# Patient Record
Sex: Male | Born: 1960 | Race: White | Hispanic: No | Marital: Single | Smoking: Never smoker
Health system: Southern US, Community
[De-identification: ages and names within clinical notes are randomized; demographics above are authoritative.]

## PROBLEM LIST (undated history)

## (undated) DIAGNOSIS — K219 Gastro-esophageal reflux disease without esophagitis: Secondary | ICD-10-CM

## (undated) DIAGNOSIS — M1990 Unspecified osteoarthritis, unspecified site: Secondary | ICD-10-CM

## (undated) HISTORY — DX: Unspecified osteoarthritis, unspecified site: M19.90

## (undated) HISTORY — DX: Gastro-esophageal reflux disease without esophagitis: K21.9

---

## 1995-12-23 HISTORY — PX: SPINE SURGERY: SHX786

## 2009-07-06 ENCOUNTER — Ambulatory Visit: Payer: Self-pay | Admitting: Unknown Physician Specialty

## 2012-06-24 HISTORY — PX: COLONOSCOPY: SHX174

## 2012-08-19 ENCOUNTER — Ambulatory Visit: Payer: Self-pay | Admitting: Otolaryngology

## 2014-12-19 ENCOUNTER — Other Ambulatory Visit: Payer: Self-pay | Admitting: Family Medicine

## 2015-06-30 ENCOUNTER — Telehealth: Payer: Self-pay

## 2015-06-30 NOTE — Telephone Encounter (Signed)
Returning patient's call, he had already called back this morning and scheduled appointment with Dr. Laural BenesJohnson for Monday

## 2015-07-03 ENCOUNTER — Ambulatory Visit: Payer: Self-pay | Admitting: Family Medicine

## 2015-07-04 ENCOUNTER — Ambulatory Visit: Payer: Self-pay | Admitting: Family Medicine

## 2015-07-18 ENCOUNTER — Encounter: Payer: Self-pay | Admitting: Family Medicine

## 2015-07-18 ENCOUNTER — Ambulatory Visit (INDEPENDENT_AMBULATORY_CARE_PROVIDER_SITE_OTHER): Payer: 59 | Admitting: Family Medicine

## 2015-07-18 VITALS — BP 120/84 | HR 79 | Temp 98.9°F | Ht 69.0 in | Wt 188.0 lb

## 2015-07-18 DIAGNOSIS — G8929 Other chronic pain: Secondary | ICD-10-CM | POA: Diagnosis not present

## 2015-07-18 DIAGNOSIS — M542 Cervicalgia: Secondary | ICD-10-CM | POA: Insufficient documentation

## 2015-07-18 MED ORDER — CYCLOBENZAPRINE HCL 10 MG PO TABS: 10.0000 mg | ORAL_TABLET | Freq: Three times a day (TID) | ORAL | Status: DC | PRN

## 2015-07-18 MED ORDER — IBUPROFEN 800 MG PO TABS: 800.0000 mg | ORAL_TABLET | Freq: Three times a day (TID) | ORAL | Status: DC | PRN

## 2015-07-18 NOTE — Assessment & Plan Note (Signed)
Given spinous process tenderness, will obtain x-ray of his neck. Likely due to spasm. Will start flexeril and ibuprofen. Advised him to avoid any other NSAIDs when he takes it. Recheck in 2 weeks. Continue to monitor closely.

## 2015-07-18 NOTE — Progress Notes (Signed)
BP 120/84 mmHg  Pulse 79  Temp(Src) 98.9 F (37.2 C)  Ht  (1.753 m)  Wt 188 lb (85.276 kg)  BMI 27.75 kg/m2  SpO2 97%   Subjective:    Patient ID: Andres Dillon, male    DOB: 1961-04-08, 55 y.o.   MRN: [id number]  HPI: HORACIO WERTH is a 55 y.o. male  Chief Complaint  Patient presents with  . Neck Pain    upper back also, mainly neck   Had a bad fall in 1997. Had surgery on his back at that time. Neck has been hurting off and on for years only hurt for a day or so. For the past 2.5 weeks he has had more pain, has been pretty constant. Didn't do anything to hurt it. Doesn't remember when it started hurting. Doesn't remember what he was doing.  NECK PAIN FOLLOW UP- has had pain in his neck for about 2.5 weeks this time. Has had numbness and tingling in his R arm for years, has not had any changes in his numbness and tingling  Status: exacerbated Treatments attempted: biofreeze, tiger balm, heat and ibuprofen  Relief with NSAIDs?:  no Location:C7 and midline Duration: 2.5 weeks Severity: 8/10 Quality: protruding, "pain", sharp Frequency: constant Radiation: none Aggravating factors: movement Alleviating factors: tiger balm and biofreeze, laying down with a special pillow Weakness:  no Paresthesias / decreased sensation:  no  Fevers:  no  Relevant past medical, surgical, family and social history reviewed and updated as indicated. Interim medical history since our last visit reviewed. Allergies and medications reviewed and updated.  Review of Systems  Constitutional: Negative.   Respiratory: Negative.   Cardiovascular: Negative.   Gastrointestinal: Negative.   Musculoskeletal: Positive for myalgias, back pain, neck pain and neck stiffness. Negative for joint swelling, arthralgias and gait problem.  Psychiatric/Behavioral: Negative.     Per HPI unless specifically indicated above     Objective:    BP 120/84 mmHg  Pulse 79  Temp(Src) 98.9 F (37.2 C)  Ht 5'  9" (1.753 m)  Wt 188 lb (85.276 kg)  BMI 27.75 kg/m2  SpO2 97%  Wt Readings from Last 3 Encounters:  07/18/15 188 lb (85.276 kg)    Physical Exam  Constitutional: He is oriented to person, place, and time. He appears well-developed and well-nourished. No distress.  HENT:  Head: Normocephalic and atraumatic.  Right Ear: Hearing normal.  Left Ear: Hearing normal.  Nose: Nose normal.  Eyes: Conjunctivae, EOM and lids are normal. Pupils are equal, round, and reactive to light. Right eye exhibits no discharge. Left eye exhibits no discharge. No scleral icterus.  Neck: No JVD present. No tracheal deviation present. No thyromegaly present.  Cardiovascular: Normal rate, regular rhythm, normal heart sounds and intact distal pulses.  Exam reveals no gallop and no friction rub.   No murmur heard. Pulmonary/Chest: Effort normal and breath sounds normal. No stridor. No respiratory distress. He has no wheezes. He has no rales. He exhibits no tenderness.  Lymphadenopathy:    He has no cervical adenopathy.  Neurological: He is alert and oriented to person, place, and time. He has normal reflexes. He displays normal reflexes. No cranial nerve deficit. He exhibits normal muscle tone. Coordination normal.  Skin: Skin is warm, dry and intact. No rash noted. He is not diaphoretic. No erythema. No pallor.  Psychiatric: He has a normal mood and affect. His speech is normal and behavior is normal. Judgment and thought content normal. Cognition and memory  are normal.  Neck Exam:    Tenderness to Palpation: yes    Midline cervical spine: yes C6-7     Paraspinal neck musculature: yes    Trapezius: yes on the L with significant spasm    Sternocleidomastoid: no     Range of Motion, active and passive    Flexion: Decreased    Extension: Decreased    Lateral rotation: Decreased    Lateral bending: Decreased     Neuro Examination: Upper extremity DTRs normal & symmetric.  Strength and sensation intact.      Special Tests:     Spurling test: negative bilaterally    Addson's: Negative bilaterally   No results found for this or any previous visit.    Assessment & Plan:   Problem List Items Addressed This Visit      Other   Chronic neck pain - Primary    Given spinous process tenderness, will obtain x-ray of his neck. Likely due to spasm. Will start flexeril and ibuprofen. Advised him to avoid any other NSAIDs when he takes it. Recheck in 2 weeks. Continue to monitor closely.      Relevant Medications   cyclobenzaprine (FLEXERIL) 10 MG tablet   ibuprofen (ADVIL,MOTRIN) 800 MG tablet   Other Relevant Orders   DG Cervical Spine Complete       Follow up plan: Return in about 2 weeks (around 08/01/2015) for Follow up on his neck.

## 2015-07-19 ENCOUNTER — Ambulatory Visit
Admission: RE | Admit: 2015-07-19 | Discharge: 2015-07-19 | Disposition: A | Discharge status: Home / Self Care | Payer: 59 | Source: Ambulatory Visit | Attending: Family Medicine | Admitting: Family Medicine

## 2015-07-19 ENCOUNTER — Telehealth: Payer: Self-pay | Admitting: Family Medicine

## 2015-07-19 DIAGNOSIS — M542 Cervicalgia: Principal | ICD-10-CM

## 2015-07-19 DIAGNOSIS — G8929 Other chronic pain: Secondary | ICD-10-CM | POA: Insufficient documentation

## 2015-07-19 NOTE — Telephone Encounter (Signed)
Called and discussed x-ray results. Will try medicine and exercises and check back in. Continue to monitor.

## 2015-08-03 ENCOUNTER — Ambulatory Visit (INDEPENDENT_AMBULATORY_CARE_PROVIDER_SITE_OTHER): Payer: 59 | Admitting: Family Medicine

## 2015-08-03 ENCOUNTER — Encounter: Payer: Self-pay | Admitting: Family Medicine

## 2015-08-03 VITALS — BP 124/87 | HR 109 | Temp 98.3°F | Ht 67.6 in | Wt 189.0 lb

## 2015-08-03 DIAGNOSIS — G8929 Other chronic pain: Secondary | ICD-10-CM

## 2015-08-03 DIAGNOSIS — M542 Cervicalgia: Secondary | ICD-10-CM

## 2015-08-03 NOTE — Progress Notes (Signed)
BP 124/87 mmHg  Pulse 109  Temp(Src) 98.3 F (36.8 C)  Ht 5' 7.6" (1.717 m)  Wt 189 lb (85.73 kg)  BMI 29.08 kg/m2  SpO2 99%   Subjective:    Patient ID: Andres Dillon, male    DOB: 07/26/1960, 55 y.o.   MRN: [id number]  HPI: Andres Dillon is a 55 y.o. male  Chief Complaint  Patient presents with  . Neck Pain    Patient states that his neck is fine as long as he continues with the flexeril and motrin, but when he stops it the pain starts again.   NECK PAIN FOLLOW UP Diagnosis: arthritis and muscle spasm Status: better Treatments attempted: rest, ice, heat, APAP, ibuprofen, aleve and muscle relaxer  Compliant with recommended treatment: average Relief with NSAIDs?:  significant Location:midline and C7 Duration: 4.5 weeks Severity: 4/10 Quality:  Sharp occasionally Frequency: intermittent Radiation: none Aggravating factors: movement Alleviating factors: rest, ice, heat, NSAIDs and muscle relaxer Weakness:  no Paresthesias / decreased sensation:  no  Fevers:  no  Relevant past medical, surgical, family and social history reviewed and updated as indicated. Interim medical history since our last visit reviewed. Allergies and medications reviewed and updated.  Review of Systems  Constitutional: Negative.   Respiratory: Negative.   Cardiovascular: Negative.   Musculoskeletal: Positive for myalgias and neck stiffness. Negative for back pain, joint swelling, arthralgias, gait problem and neck pain.  Skin: Negative.   Psychiatric/Behavioral: Negative.     Per HPI unless specifically indicated above     Objective:    BP 124/87 mmHg  Pulse 109  Temp(Src) 98.3 F (36.8 C)  Ht 5' 7.6" (1.717 m)  Wt 189 lb (85.73 kg)  BMI 29.08 kg/m2  SpO2 99%  Wt Readings from Last 3 Encounters:  08/03/15 189 lb (85.73 kg)  07/18/15 188 lb (85.276 kg)    Physical Exam  Constitutional: He is oriented to person, place, and time. He appears well-developed and well-nourished. No  distress.  HENT:  Head: Normocephalic and atraumatic.  Right Ear: Hearing normal.  Left Ear: Hearing normal.  Nose: Nose normal.  Eyes: Conjunctivae and lids are normal. Right eye exhibits no discharge. Left eye exhibits no discharge. No scleral icterus.  Pulmonary/Chest: Effort normal. No respiratory distress.  Musculoskeletal: Normal range of motion. He exhibits no edema or tenderness.  Neurological: He is alert and oriented to person, place, and time. He has normal reflexes. He displays normal reflexes. No cranial nerve deficit. He exhibits normal muscle tone. Coordination normal.  Skin: Skin is warm, dry and intact. No rash noted. No erythema. No pallor.  Psychiatric: He has a normal mood and affect. His speech is normal and behavior is normal. Judgment and thought content normal. Cognition and memory are normal.  Nursing note and vitals reviewed.   No results found for this or any previous visit.    Assessment & Plan:   Problem List Items Addressed This Visit      Other   Chronic neck pain - Primary    Doing much better with medication. Has not done his stretches. Will work on them and we will check in by phone in 2 weeks, if not doing better, will see PT.          Follow up plan: Return in about 4 weeks (around 08/31/2015) for follow up neck.

## 2015-08-03 NOTE — Assessment & Plan Note (Signed)
Doing much better with medication. Has not done his stretches. Will work on them and we will check in by phone in 2 weeks, if not doing better, will see PT.

## 2015-08-12 ENCOUNTER — Other Ambulatory Visit: Payer: Self-pay | Admitting: Family Medicine

## 2015-09-07 ENCOUNTER — Other Ambulatory Visit: Payer: Self-pay | Admitting: Family Medicine

## 2015-10-10 ENCOUNTER — Other Ambulatory Visit: Payer: Self-pay | Admitting: Family Medicine

## 2016-07-02 ENCOUNTER — Other Ambulatory Visit: Payer: Self-pay | Admitting: Family Medicine

## 2016-07-02 ENCOUNTER — Encounter: Payer: Self-pay | Admitting: Family Medicine

## 2016-07-02 ENCOUNTER — Ambulatory Visit (INDEPENDENT_AMBULATORY_CARE_PROVIDER_SITE_OTHER): Payer: 59 | Admitting: Family Medicine

## 2016-07-02 VITALS — BP 122/83 | HR 77 | Temp 98.6°F | Wt 185.0 lb

## 2016-07-02 DIAGNOSIS — J069 Acute upper respiratory infection, unspecified: Secondary | ICD-10-CM

## 2016-07-02 LAB — VERITOR FLU A/B WAIVED
Influenza A: NEGATIVE
Influenza B: NEGATIVE

## 2016-07-02 MED ORDER — IBUPROFEN 800 MG PO TABS: 800.0000 mg | ORAL_TABLET | Freq: Three times a day (TID) | ORAL | 0 refills | Status: DC | PRN

## 2016-07-02 MED ORDER — BENZONATATE 100 MG PO CAPS: 200.0000 mg | ORAL_CAPSULE | Freq: Three times a day (TID) | ORAL | 0 refills | Status: DC | PRN

## 2016-07-02 MED ORDER — ALBUTEROL SULFATE HFA 108 (90 BASE) MCG/ACT IN AERS: 2.0000 | INHALATION_SPRAY | Freq: Four times a day (QID) | RESPIRATORY_TRACT | 0 refills | Status: DC | PRN

## 2016-07-02 MED ORDER — AMOXICILLIN-POT CLAVULANATE 875-125 MG PO TABS: 1.0000 | ORAL_TABLET | Freq: Two times a day (BID) | ORAL | 0 refills | Status: DC

## 2016-07-02 NOTE — Progress Notes (Signed)
BP 122/83   Pulse 77   Temp 98.6 F (37 C)   Wt 185 lb (83.9 kg)   SpO2 97%   BMI 28.46 kg/m    Subjective:    Patient ID: Andres Dillon, male    DOB: [birth year], 56 y.o.   MRN: [id number]  HPI: Andres Dillon is a 56 y.o. male  Chief Complaint  Patient presents with  . URI    x 10 days. fever, some body aches, productive cough, chest congestion, nose congestion, some scratchy throat. No ear ache.   Patient presents with 10 day history of fever, chills, body aches, sweats, productive cough with brown sputum, and congestion. Denies CP, SOB, ear pain, N/V/D. Taking mucinex, cough syrups, vapor rub with minimal relief. 99.4 Tmax over the duration. No sick contacts reported.   Past Medical History:  Diagnosis Date  . Arthritis   . GERD (gastroesophageal reflux disease)    Social History   Social History  . Marital status: Married    Spouse name: N/A  . Number of children: N/A  . Years of education: N/A   Occupational History  . Not on file.   Social History Main Topics  . Smoking status: Never Smoker  . Smokeless tobacco: Never Used  . Alcohol use Yes  . Drug use: No  . Sexual activity: Not on file   Other Topics Concern  . Not on file   Social History Narrative  . No narrative on file    Relevant past medical, surgical, family and social history reviewed and updated as indicated. Interim medical history since our last visit reviewed. Allergies and medications reviewed and updated.  Review of Systems  Constitutional: Positive for chills, diaphoresis and fever.  HENT: Positive for congestion and sore throat.   Eyes: Negative.   Respiratory: Positive for cough.   Cardiovascular: Negative.   Gastrointestinal: Negative.   Genitourinary: Negative.   Musculoskeletal: Positive for myalgias.  Neurological: Negative.   Psychiatric/Behavioral: Negative.     Per HPI unless specifically indicated above     Objective:    BP 122/83   Pulse 77   Temp 98.6  F (37 C)   Wt 185 lb (83.9 kg)   SpO2 97%   BMI 28.46 kg/m   Wt Readings from Last 3 Encounters:  07/02/16 185 lb (83.9 kg)  08/03/15 189 lb (85.7 kg)  07/18/15 188 lb (85.3 kg)    Physical Exam  Constitutional: He is oriented to person, place, and time. He appears well-developed and well-nourished.  HENT:  Head: Atraumatic.  Eyes: Conjunctivae are normal. Pupils are equal, round, and reactive to light.  Neck: Normal range of motion. Neck supple.  Cardiovascular: Normal rate and normal heart sounds.   Pulmonary/Chest: Effort normal. No respiratory distress. He has wheezes (minimal wheezes right lung). He has no rales.  Musculoskeletal: Normal range of motion.  Neurological: He is alert and oriented to person, place, and time.  Skin: Skin is warm and dry.  Psychiatric: He has a normal mood and affect. His behavior is normal.  Nursing note and vitals reviewed.  Rapid flu test - negative  No results found for this or any previous visit.    Assessment & Plan:   Problem List Items Addressed This Visit    None    Visit Diagnoses    Upper respiratory tract infection, unspecified type    -  Primary   Pt specifically requesting augmentin over z-pak, will also send tessalon perles and  albuterol for prn use. Supportive care discussed.    Relevant Orders   Influenza A & B (STAT)       Follow up plan: Return if symptoms worsen or fail to improve.

## 2016-07-02 NOTE — Patient Instructions (Signed)
Follow up as needed

## 2016-07-02 NOTE — Telephone Encounter (Signed)
Routing to provider. It looks like the tessalon perles should be at the pharmacy, but can you resend it?  He also wants a refill on the Ibuprofen 800mg .

## 2016-12-18 ENCOUNTER — Ambulatory Visit (INDEPENDENT_AMBULATORY_CARE_PROVIDER_SITE_OTHER): Payer: 59 | Admitting: Family Medicine

## 2016-12-18 ENCOUNTER — Encounter: Payer: Self-pay | Admitting: Family Medicine

## 2016-12-18 DIAGNOSIS — Z114 Encounter for screening for human immunodeficiency virus [HIV]: Secondary | ICD-10-CM

## 2016-12-18 DIAGNOSIS — G8929 Other chronic pain: Secondary | ICD-10-CM

## 2016-12-18 DIAGNOSIS — Z1159 Encounter for screening for other viral diseases: Secondary | ICD-10-CM

## 2016-12-18 DIAGNOSIS — Z131 Encounter for screening for diabetes mellitus: Secondary | ICD-10-CM | POA: Diagnosis not present

## 2016-12-18 DIAGNOSIS — R0602 Shortness of breath: Secondary | ICD-10-CM | POA: Diagnosis not present

## 2016-12-18 DIAGNOSIS — Z1329 Encounter for screening for other suspected endocrine disorder: Secondary | ICD-10-CM

## 2016-12-18 DIAGNOSIS — Z13220 Encounter for screening for lipoid disorders: Secondary | ICD-10-CM

## 2016-12-18 DIAGNOSIS — R0600 Dyspnea, unspecified: Secondary | ICD-10-CM | POA: Insufficient documentation

## 2016-12-18 DIAGNOSIS — M542 Cervicalgia: Secondary | ICD-10-CM | POA: Diagnosis not present

## 2016-12-18 DIAGNOSIS — Z125 Encounter for screening for malignant neoplasm of prostate: Secondary | ICD-10-CM

## 2016-12-18 DIAGNOSIS — Z981 Arthrodesis status: Secondary | ICD-10-CM

## 2016-12-18 DIAGNOSIS — Z0001 Encounter for general adult medical examination with abnormal findings: Secondary | ICD-10-CM

## 2016-12-18 DIAGNOSIS — R0609 Other forms of dyspnea: Secondary | ICD-10-CM | POA: Insufficient documentation

## 2016-12-18 DIAGNOSIS — Z0000 Encounter for general adult medical examination without abnormal findings: Secondary | ICD-10-CM

## 2016-12-18 LAB — URINALYSIS, ROUTINE W REFLEX MICROSCOPIC
BILIRUBIN UA: NEGATIVE
GLUCOSE, UA: NEGATIVE
Ketones, UA: NEGATIVE
Leukocytes, UA: NEGATIVE
NITRITE UA: NEGATIVE
Protein, UA: NEGATIVE
RBC UA: NEGATIVE
Specific Gravity, UA: 1.015 (ref 1.005–1.030)
UUROB: 0.2 mg/dL (ref 0.2–1.0)
pH, UA: 6 (ref 5.0–7.5)

## 2016-12-18 NOTE — Progress Notes (Signed)
There were no vitals taken for this visit.   Subjective:    Patient ID: Andres Dillon, male    DOB: 01/14/1961, 56 y.o.   MRN: [id number]  HPI: Andres Dillon is a 56 y.o. male  Chief Complaint  Patient presents with  . Annual Exam  Patient for physical biggest concerns are negative and back ongoing chronic pain and is status post lumbar fusion from 1997 on lower back. Does have chronic neck pain feels it's possibly related to a fall he had 20 years or so ago. Has already scheduled appointments with neurosurgery is going to do some imaging studies later this summer.  Patient also tosses and turns all night long sometimes takes an ibuprofen or something which may help.  Patient also with concerns has family history of coronary artery disease has developed rectal dysfunction over the last year or so has some shortness of breath and heaviness sensation with climbing steps no real chest pain nausea vomiting or radiation. This is been ongoing for several years maybe getting worse over the last months. Also ED sx.  Relevant past medical, surgical, family and social history reviewed and updated as indicated. Interim medical history since our last visit reviewed. Allergies and medications reviewed and updated.  Review of Systems  Constitutional: Negative.   HENT: Negative.   Eyes: Negative.   Respiratory: Negative.   Cardiovascular: Negative.   Gastrointestinal: Negative.   Endocrine: Negative.   Genitourinary: Negative.   Musculoskeletal: Negative.   Skin: Negative.   Allergic/Immunologic: Negative.   Neurological: Negative.   Hematological: Negative.   Psychiatric/Behavioral: Negative.    EKG normal sinus with normal EKG.  Per HPI unless specifically indicated above     Objective:    There were no vitals taken for this visit.  Wt Readings from Last 3 Encounters:  07/02/16 185 lb (83.9 kg)  08/03/15 189 lb (85.7 kg)  07/18/15 188 lb (85.3 kg)    Physical Exam    Constitutional: He is oriented to person, place, and time. He appears well-developed and well-nourished.  HENT:  Head: Normocephalic and atraumatic.  Right Ear: External ear normal.  Left Ear: External ear normal.  Eyes: Conjunctivae and EOM are normal. Pupils are equal, round, and reactive to light.  Neck: Normal range of motion. Neck supple.  Cardiovascular: Normal rate, regular rhythm, normal heart sounds and intact distal pulses.   Pulmonary/Chest: Effort normal and breath sounds normal.  Abdominal: Soft. Bowel sounds are normal. There is no splenomegaly or hepatomegaly.  Genitourinary: Rectum normal, prostate normal and penis normal.  Musculoskeletal: Normal range of motion.  Neurological: He is alert and oriented to person, place, and time. He has normal reflexes.  Skin: No rash noted. No erythema.  Psychiatric: He has a normal mood and affect. His behavior is normal. Judgment and thought content normal.    Results for orders placed or performed in visit on 07/02/16  Influenza A & B (STAT)  Result Value Ref Range   Influenza A Negative Negative   Influenza B Negative Negative      Assessment & Plan:   Problem List Items Addressed This Visit      Other   Chronic neck pain    Having check in 1 mo      S/P lumbar fusion   Short of breath on exertion    EKG normal but with patient's symptoms family history ED will refer to cardiology to further evaluate.      Relevant Orders  EKG 12-Lead (Completed)   Ambulatory referral to Cardiology    Other Visit Diagnoses    Annual physical exam    -  Primary   Relevant Orders   Comprehensive metabolic panel   Screening cholesterol level       Relevant Orders   Lipid panel   Thyroid disorder screen       Relevant Orders   TSH   Screening for diabetes mellitus (DM)       Relevant Orders   CBC with Differential/Platelet   Urinalysis, Routine w reflex microscopic   Prostate cancer screening       Relevant Orders   PSA    Need for hepatitis C screening test       Relevant Orders   Hepatitis C Antibody   Encounter for screening for HIV       Relevant Orders   HIV antibody (with reflex)       Follow up plan: Return in about 1 year (around 12/18/2017), or if symptoms worsen or fail to improve, for Physical Exam.

## 2016-12-18 NOTE — Assessment & Plan Note (Signed)
EKG normal but with patient's symptoms family history ED will refer to cardiology to further evaluate.

## 2016-12-18 NOTE — Assessment & Plan Note (Signed)
Having check in 1 mo

## 2016-12-19 ENCOUNTER — Encounter: Payer: Self-pay | Admitting: Family Medicine

## 2016-12-19 LAB — COMPREHENSIVE METABOLIC PANEL
A/G RATIO: 1.9 (ref 1.2–2.2)
ALT: 28 IU/L (ref 0–44)
AST: 19 IU/L (ref 0–40)
Albumin: 4.4 g/dL (ref 3.5–5.5)
Alkaline Phosphatase: 53 IU/L (ref 39–117)
BUN / CREAT RATIO: 14 (ref 9–20)
BUN: 14 mg/dL (ref 6–24)
Bilirubin Total: 0.7 mg/dL (ref 0.0–1.2)
CO2: 23 mmol/L (ref 20–29)
Calcium: 9.5 mg/dL (ref 8.7–10.2)
Chloride: 103 mmol/L (ref 96–106)
Creatinine, Ser: 1.03 mg/dL (ref 0.76–1.27)
GFR calc non Af Amer: 81 mL/min/{1.73_m2} (ref >59)
GFR, EST AFRICAN AMERICAN: 94 mL/min/{1.73_m2} (ref >59)
GLOBULIN, TOTAL: 2.3 g/dL (ref 1.5–4.5)
Glucose: 95 mg/dL (ref 65–99)
POTASSIUM: 4.1 mmol/L (ref 3.5–5.2)
SODIUM: 142 mmol/L (ref 134–144)
TOTAL PROTEIN: 6.7 g/dL (ref 6.0–8.5)

## 2016-12-19 LAB — CBC WITH DIFFERENTIAL/PLATELET
BASOS: 0 %
Basophils Absolute: 0 10*3/uL (ref 0.0–0.2)
EOS (ABSOLUTE): 0.2 10*3/uL (ref 0.0–0.4)
EOS: 3 %
HEMATOCRIT: 40 % (ref 37.5–51.0)
Hemoglobin: 13.3 g/dL (ref 13.0–17.7)
Immature Grans (Abs): 0 10*3/uL (ref 0.0–0.1)
Immature Granulocytes: 1 %
Lymphocytes Absolute: 1.3 10*3/uL (ref 0.7–3.1)
Lymphs: 25 %
MCH: 30 pg (ref 26.6–33.0)
MCHC: 33.3 g/dL (ref 31.5–35.7)
MCV: 90 fL (ref 79–97)
MONOS ABS: 0.4 10*3/uL (ref 0.1–0.9)
Monocytes: 8 %
NEUTROS PCT: 63 %
Neutrophils Absolute: 3.3 10*3/uL (ref 1.4–7.0)
Platelets: 214 10*3/uL (ref 150–379)
RBC: 4.43 x10E6/uL (ref 4.14–5.80)
RDW: 14.2 % (ref 12.3–15.4)
WBC: 5.2 10*3/uL (ref 3.4–10.8)

## 2016-12-19 LAB — LIPID PANEL
Chol/HDL Ratio: 4.4 ratio (ref 0.0–5.0)
Cholesterol, Total: 196 mg/dL (ref 100–199)
HDL: 45 mg/dL (ref >39)
LDL Calculated: 120 mg/dL — ABNORMAL HIGH (ref 0–99)
Triglycerides: 155 mg/dL — ABNORMAL HIGH (ref 0–149)
VLDL Cholesterol Cal: 31 mg/dL (ref 5–40)

## 2016-12-19 LAB — PSA: Prostate Specific Ag, Serum: 2.3 ng/mL (ref 0.0–4.0)

## 2016-12-19 LAB — HIV ANTIBODY (ROUTINE TESTING W REFLEX): HIV SCREEN 4TH GENERATION: NONREACTIVE

## 2016-12-19 LAB — HEPATITIS C ANTIBODY

## 2016-12-19 LAB — TSH: TSH: 1.16 u[IU]/mL (ref 0.450–4.500)

## 2017-03-19 ENCOUNTER — Encounter: Payer: Self-pay | Admitting: Internal Medicine

## 2017-03-19 ENCOUNTER — Ambulatory Visit (INDEPENDENT_AMBULATORY_CARE_PROVIDER_SITE_OTHER): Payer: BLUE CROSS/BLUE SHIELD | Admitting: Internal Medicine

## 2017-03-19 VITALS — BP 110/68 | HR 89 | Ht 69.0 in | Wt 191.5 lb

## 2017-03-19 DIAGNOSIS — M542 Cervicalgia: Secondary | ICD-10-CM

## 2017-03-19 DIAGNOSIS — R0609 Other forms of dyspnea: Secondary | ICD-10-CM | POA: Diagnosis not present

## 2017-03-19 DIAGNOSIS — R0600 Dyspnea, unspecified: Secondary | ICD-10-CM

## 2017-03-19 LAB — EKG 12-LEAD

## 2017-03-19 NOTE — Patient Instructions (Signed)
Medication Instructions:  Your physician recommends that you continue on your current medications as directed. Please refer to the Current Medication list given to you today.   Labwork: none  Testing/Procedures: Your physician has requested that you have an echocardiogram. Echocardiography is a painless test that uses sound waves to create images of your heart. It provides your doctor with information about the size and shape of your heart and how well your heart's chambers and valves are working. This procedure takes approximately one hour. There are no restrictions for this procedure.  Your physician has requested that you have an exercise tolerance test. For further information please visit [email or web address] Please also follow instruction sheet, as given.    Follow-Up: Your physician recommends that you schedule a follow-up appointment in: 1 MONTH WITH DR END.     Exercise Stress Electrocardiogram An exercise stress electrocardiogram is a test to check how blood flows to your heart. It is done to find areas of poor blood flow. You will need to walk on a treadmill for this test. The electrocardiogram will record your heartbeat when you are at rest and when you are exercising. What happens before the procedure?  Do not have drinks with caffeine or foods with caffeine for 24 hours before the test, or as told by your doctor. This includes coffee, tea (even decaf tea), sodas, chocolate, and cocoa.  Follow your doctor's instructions about eating and drinking before the test.  Ask your doctor what medicines you should or should not take before the test. Take your medicines with water unless told by your doctor not to.  If you use an inhaler, bring it with you to the test.  Bring a snack to eat after the test.  Do not  smoke for 4 hours before the test.  Do not put lotions, powders, creams, or oils on your chest before the test.  Wear comfortable shoes and clothing. What happens  during the procedure?  You will have patches put on your chest. Small areas of your chest may need to be shaved. Wires will be connected to the patches.  Your heart rate will be watched while you are resting and while you are exercising.  You will walk on the treadmill. The treadmill will slowly get faster to raise your heart rate.  The test will take about 1-2 hours. What happens after the procedure?  Your heart rate and blood pressure will be watched after the test.  You may return to your normal diet, activities, and medicines or as told by your doctor. This information is not intended to replace advice given to you by your health care provider. Make sure you discuss any questions you have with your health care provider. Document Released: 11/27/2007 Document Revised: 02/07/2016 Document Reviewed: 02/15/2013 Elsevier Interactive Patient Education  2018 [employer].    Echocardiogram An echocardiogram, or echocardiography, uses sound waves (ultrasound) to produce an image of your heart. The echocardiogram is simple, painless, obtained within a short period of time, and offers valuable information to your health care provider. The images from an echocardiogram can provide information such as:  Evidence of coronary artery disease (CAD).  Heart size.  Heart muscle function.  Heart valve function.  Aneurysm detection.  Evidence of a past heart attack.  Fluid buildup around the heart.  Heart muscle thickening.  Assess heart valve function.  Tell a health care provider about:  Any allergies you have.  All medicines you are taking, including vitamins, herbs, eye drops, creams,  and over-the-counter medicines.  Any problems you or family members have had with anesthetic medicines.  Any blood disorders you have.  Any surgeries you have had.  Any medical conditions you have.  Whether you are pregnant or may be pregnant. What happens before the procedure? No special  preparation is needed. Eat and drink normally. What happens during the procedure?  In order to produce an image of your heart, gel will be applied to your chest and a wand-like tool (transducer) will be moved over your chest. The gel will help transmit the sound waves from the transducer. The sound waves will harmlessly bounce off your heart to allow the heart images to be captured in real-time motion. These images will then be recorded.  You may need an IV to receive a medicine that improves the quality of the pictures. What happens after the procedure? You may return to your normal schedule including diet, activities, and medicines, unless your health care provider tells you otherwise. This information is not intended to replace advice given to you by your health care provider. Make sure you discuss any questions you have with your health care provider. Document Released: 06/07/2000 Document Revised: 01/27/2016 Document Reviewed: 02/15/2013 Elsevier Interactive Patient Education  2017 [employer].

## 2017-03-19 NOTE — Progress Notes (Signed)
New Outpatient Visit Date: 03/19/2017  Referring Provider: Steele Sizer, MD [address] [place], [place] [address]  Chief Complaint: Shortness of breath  HPI:  Andres Dillon is a 56 y.o. male who is being seen today for the evaluation of shortness of breath and chest heaviness with exertion at the request of Dr. Dossie Arbour. He has a history of GERD and arthritis. Andres Dillon has noted mild shortness of breath when climbing stairs. He denies chest pain, though he has experienced significant neck pain for the last 1.5 years. He wonders if this could be his anginal equivalent. He describes the neck pain as sharp. It initially began along the back of his neck but is now mostly at the base of his neck on the right. The pain is not exertional or positional. There are no exacerbating or alleviating factors. He also denies associated symptoms. He fell from a balcony onto his head and neck several years ago. He states that prior radiographs revealed a bone spur. He was referred for a c-spine MRI but never completed the test.  Andres Dillon denies a history of prior heart disease or testing. He notes that his father had a CABG in his 1's.  --------------------------------------------------------------------------------------------------  Cardiovascular History & Procedures: Cardiovascular Problems:  Dyspnea on exertion  Neck pain (? anginal equivalent)  Risk Factors:  Male gender and age > 84  Cath/PCI:  None  CV Surgery:  None  EP Procedures and Devices:  None  Non-Invasive Evaluation(s):  None  Recent CV Pertinent Labs: Lab Results  Component Value Date   CHOL 196 12/18/2016   HDL 45 12/18/2016   LDLCALC 120 (H) 12/18/2016   TRIG 155 (H) 12/18/2016   CHOLHDL 4.4 12/18/2016   K 4.1 12/18/2016   BUN 14 12/18/2016   CREATININE 1.03 12/18/2016    --------------------------------------------------------------------------------------------------  Past Medical History:    Diagnosis Date  . Arthritis   . GERD (gastroesophageal reflux disease)     Past Surgical History:  Procedure Laterality Date  . COLONOSCOPY  2014  . SPINE SURGERY  12/23/1995   fell/ rods and screws T10 /L2 fusions    Current Meds  Medication Sig  . ibuprofen (ADVIL,MOTRIN) 800 MG tablet Take 1 tablet (800 mg total) by mouth every 8 (eight) hours as needed.    Allergies: Patient has no known allergies.  Social History   Social History  . Marital status: Married    Spouse name: N/A  . Number of children: N/A  . Years of education: N/A   Occupational History  . Not on file.   Social History Main Topics  . Smoking status: Never Smoker  . Smokeless tobacco: Never Used  . Alcohol use 7.2 oz/week    12 Cans of beer per week  . Drug use: No  . Sexual activity: Not on file   Other Topics Concern  . Not on file   Social History Narrative  . No narrative on file    Family History  Problem Relation Age of Onset  . Cancer Mother   . Cancer Father   . Heart disease Father 57       CABG   . Kidney disease Father     Review of Systems: Andres Dillon notes some daytime fatigue. He does not feel refreshed in the mornings. He also has intermittent pain in his legs (especially at night), which he describes as cramps. He also notes erectile dysfunction. Otherwise, a 12-system review of systems was performed and was  negative except as noted in the HPI.  --------------------------------------------------------------------------------------------------  Physical Exam: BP 110/68 (BP Location: Right Arm, Patient Position: Sitting, Cuff Size: Normal)   Pulse 89   Ht  (1.753 m)   Wt 191 lb 8 oz (86.9 kg)   BMI 28.28 kg/m   General:  Overweight man, seated comfortably in the exam room. HEENT: No conjunctival pallor or scleral icterus. Moist mucous membranes. OP clear. Neck: Supple without lymphadenopathy, thyromegaly, JVD, or HJR. No carotid bruit. Lungs: Normal work of  breathing. Clear to auscultation bilaterally without wheezes or crackles. Heart: Regular rate and rhythm without murmurs, rubs, or gallops. Non-displaced PMI. Abd: Bowel sounds present. Soft, NT/ND without hepatosplenomegaly Ext: No lower extremity edema. Radial, PT, and DP pulses are 2+ bilaterally Skin: Warm and dry without rash. Neuro: CNIII-XII intact. Strength and fine-touch sensation intact in upper and lower extremities bilaterally. No neck tenderness. Psych: Normal mood and affect.  EKG:  Normal sinus rhythm with non-specific T-wave abnormality.  Lab Results  Component Value Date   WBC 5.2 12/18/2016   HGB 13.3 12/18/2016   HCT 40.0 12/18/2016   MCV 90 12/18/2016   PLT 214 12/18/2016    Lab Results  Component Value Date   NA 142 12/18/2016   K 4.1 12/18/2016   CL 103 12/18/2016   CO2 23 12/18/2016   BUN 14 12/18/2016   CREATININE 1.03 12/18/2016   GLUCOSE 95 12/18/2016   ALT 28 12/18/2016    Lab Results  Component Value Date   CHOL 196 12/18/2016   HDL 45 12/18/2016   LDLCALC 120 (H) 12/18/2016   TRIG 155 (H) 12/18/2016   CHOLHDL 4.4 12/18/2016   --------------------------------------------------------------------------------------------------  ASSESSMENT AND PLAN: Dyspnea on exertion Mild and present for several years. Exam is unrevealing EKG shows non-specific T-wave changes but no significant abnormalities. We have agreed to obtain an exercise tolerance test and transthoracic echocardiogram for further evaluation.  Neck pain Pain is most consistent with a musculoskeletal or neurologic etiology, particularly given history of prior trauma. We will obtain an ETT to exclude CAD, should this be an unusual angina equivalent. I will defer further MSK work-up to Dr. Dossie Arbour.  Follow-up: Return to clinic in 1 month. If ETT and TEE are normal, he can follow-up on a PRN basis.  Yvonne Kendall, MD 03/19/2017 9:09 AM

## 2017-03-24 ENCOUNTER — Other Ambulatory Visit: Payer: BLUE CROSS/BLUE SHIELD

## 2017-05-07 ENCOUNTER — Ambulatory Visit: Payer: BLUE CROSS/BLUE SHIELD | Admitting: Internal Medicine

## 2017-07-08 ENCOUNTER — Telehealth: Payer: Self-pay

## 2017-07-08 DIAGNOSIS — Z1211 Encounter for screening for malignant neoplasm of colon: Secondary | ICD-10-CM

## 2017-07-08 NOTE — Telephone Encounter (Signed)
Copied from CRM [id number]. Topic: Referral - Request >> Jul 08, 2017 10:41 AM Landry MellowFoltz, Melissa J wrote: Reason for CRM: pt would like to have colonoscopy. He had one 5 years ago and thinks it was with Dr Mechele Collinelliott.   Cb# is [phone]

## 2017-08-20 ENCOUNTER — Encounter: Payer: Self-pay | Admitting: Family Medicine

## 2017-08-20 ENCOUNTER — Ambulatory Visit: Payer: BLUE CROSS/BLUE SHIELD | Admitting: Family Medicine

## 2017-08-20 VITALS — BP 119/88 | HR 99 | Temp 98.2°F | Ht 69.0 in | Wt 191.0 lb

## 2017-08-20 DIAGNOSIS — J209 Acute bronchitis, unspecified: Secondary | ICD-10-CM | POA: Diagnosis not present

## 2017-08-20 MED ORDER — AZITHROMYCIN 250 MG PO TABS: ORAL_TABLET | ORAL | 0 refills | Status: DC

## 2017-08-20 MED ORDER — ALBUTEROL SULFATE (2.5 MG/3ML) 0.083% IN NEBU: 2.5000 mg | INHALATION_SOLUTION | Freq: Once | RESPIRATORY_TRACT | Status: DC

## 2017-08-20 MED ORDER — PREDNISONE 50 MG PO TABS: 50.0000 mg | ORAL_TABLET | Freq: Every day | ORAL | 0 refills | Status: DC

## 2017-08-20 MED ORDER — HYDROCOD POLST-CPM POLST ER 10-8 MG/5ML PO SUER: 5.0000 mL | Freq: Every evening | ORAL | 0 refills | Status: DC | PRN

## 2017-08-20 NOTE — Progress Notes (Signed)
BP 119/88   Pulse 99   Temp 98.2 F (36.8 C) (Oral)   Ht 5\' 9"  (1.753 m)   Wt 191 lb (86.6 kg)   SpO2 99%   BMI 28.21 kg/m    Subjective:    Patient ID: Andres Dillon, male    DOB: 09/08/1960, 57 y.o.   MRN: [id number]  HPI: Andres PlyDavid C Langenberg is a 57 y.o. male  Chief Complaint  Patient presents with  . URI    Pt been sick x 1.5 weeks. Pt had old RX for augmentin that he took, and started to feel better. Symptoms returned after d/c'ing antibiotic. Pt hoarse w/ productive cough and chest congestion.    UPPER RESPIRATORY TRACT INFECTION Duration: 1-1.5 weeks Worst symptom: cough Fever: no Cough: yes Shortness of breath: no Wheezing: yes Chest pain: no Chest tightness: yes Chest congestion: yes Nasal congestion: yes Runny nose: no Post nasal drip: no Sneezing: no Sore throat: yes Swollen glands: no Sinus pressure: no Headache: no Face pain: no Toothache: no Ear pain: no  Ear pressure: no  Eyes red/itching:no Eye drainage/crusting: no  Vomiting: no Rash: no Fatigue: yes Sick contacts: yes Strep contacts: no  Context: worse Recurrent sinusitis: no Relief with OTC cold/cough medications: no  Treatments attempted: mucinex, anti-histamine, pseudoephedrine, cough syrup and antibiotics    Relevant past medical, surgical, family and social history reviewed and updated as indicated. Interim medical history since our last visit reviewed. Allergies and medications reviewed and updated.  Review of Systems  Constitutional: Positive for fatigue. Negative for activity change, appetite change, chills, diaphoresis, fever and unexpected weight change.  HENT: Positive for congestion, postnasal drip, rhinorrhea, sinus pressure and voice change. Negative for dental problem, drooling, ear discharge, ear pain, facial swelling, hearing loss, mouth sores, nosebleeds, sinus pain, sneezing, sore throat, tinnitus and trouble swallowing.   Respiratory: Positive for shortness of breath  and wheezing. Negative for apnea, cough, choking, chest tightness and stridor.   Cardiovascular: Negative.   Gastrointestinal: Negative.   Psychiatric/Behavioral: Negative.     Per HPI unless specifically indicated above     Objective:    BP 119/88   Pulse 99   Temp 98.2 F (36.8 C) (Oral)   Ht 5\' 9"  (1.753 m)   Wt 191 lb (86.6 kg)   SpO2 99%   BMI 28.21 kg/m   Wt Readings from Last 3 Encounters:  08/20/17 191 lb (86.6 kg)  03/19/17 191 lb 8 oz (86.9 kg)  07/02/16 185 lb (83.9 kg)    Physical Exam  Constitutional: He is oriented to person, place, and time. He appears well-developed and well-nourished. No distress.  HENT:  Head: Normocephalic and atraumatic.  Right Ear: Hearing and external ear normal.  Left Ear: Hearing and external ear normal.  Nose: Nose normal.  Mouth/Throat: Oropharynx is clear and moist. No oropharyngeal exudate.  Eyes: Conjunctivae, EOM and lids are normal. Pupils are equal, round, and reactive to light. Right eye exhibits no discharge. Left eye exhibits no discharge. No scleral icterus.  Neck: Normal range of motion. Neck supple. No JVD present. No tracheal deviation present. No thyromegaly present.  Cardiovascular: Normal rate, regular rhythm, normal heart sounds and intact distal pulses. Exam reveals no gallop and no friction rub.  No murmur heard. Pulmonary/Chest: Effort normal. No stridor. No respiratory distress. He has wheezes. He has no rales. He exhibits no tenderness.  Musculoskeletal: Normal range of motion.  Lymphadenopathy:    He has no cervical adenopathy.  Neurological: He  is alert and oriented to person, place, and time.  Skin: Skin is warm, dry and intact. No rash noted. He is not diaphoretic. No erythema. No pallor.  Psychiatric: He has a normal mood and affect. His speech is normal and behavior is normal. Judgment and thought content normal. Cognition and memory are normal.  Nursing note and vitals reviewed.   Results for orders  placed or performed in visit on 03/19/17  EKG 12-Lead  Result Value Ref Range   Result 2        Assessment & Plan:   Problem List Items Addressed This Visit    None    Visit Diagnoses    Acute bronchitis, unspecified organism    -  Primary   Will treat with prednisone and azithromycin. Tussionex for comfort. Call with any concerns or if not getting better.   Relevant Medications   albuterol (PROVENTIL) (2.5 MG/3ML) 0.083% nebulizer solution 2.5 mg       Follow up plan: Return if symptoms worsen or fail to improve.

## 2017-08-27 ENCOUNTER — Encounter: Payer: Self-pay | Admitting: Family Medicine

## 2017-10-16 DIAGNOSIS — Z800 Family history of malignant neoplasm of digestive organs: Secondary | ICD-10-CM | POA: Insufficient documentation

## 2017-10-16 DIAGNOSIS — R1011 Right upper quadrant pain: Secondary | ICD-10-CM | POA: Diagnosis not present

## 2017-10-16 DIAGNOSIS — S2231XA Fracture of one rib, right side, initial encounter for closed fracture: Secondary | ICD-10-CM | POA: Diagnosis not present

## 2017-10-16 DIAGNOSIS — R0781 Pleurodynia: Secondary | ICD-10-CM | POA: Diagnosis not present

## 2017-10-16 DIAGNOSIS — K2100 Gastro-esophageal reflux disease with esophagitis, without bleeding: Secondary | ICD-10-CM | POA: Insufficient documentation

## 2017-10-16 DIAGNOSIS — M47812 Spondylosis without myelopathy or radiculopathy, cervical region: Secondary | ICD-10-CM | POA: Diagnosis not present

## 2017-10-16 DIAGNOSIS — M25511 Pain in right shoulder: Secondary | ICD-10-CM | POA: Diagnosis not present

## 2017-10-16 DIAGNOSIS — R0602 Shortness of breath: Secondary | ICD-10-CM | POA: Diagnosis not present

## 2017-10-16 DIAGNOSIS — S4991XA Unspecified injury of right shoulder and upper arm, initial encounter: Secondary | ICD-10-CM | POA: Diagnosis not present

## 2017-10-16 DIAGNOSIS — D72829 Elevated white blood cell count, unspecified: Secondary | ICD-10-CM | POA: Diagnosis not present

## 2017-10-16 DIAGNOSIS — M542 Cervicalgia: Secondary | ICD-10-CM | POA: Diagnosis not present

## 2017-10-16 DIAGNOSIS — Z981 Arthrodesis status: Secondary | ICD-10-CM | POA: Diagnosis not present

## 2017-10-16 DIAGNOSIS — M545 Low back pain: Secondary | ICD-10-CM | POA: Diagnosis not present

## 2017-10-16 DIAGNOSIS — S3992XA Unspecified injury of lower back, initial encounter: Secondary | ICD-10-CM | POA: Diagnosis not present

## 2017-10-16 DIAGNOSIS — S299XXA Unspecified injury of thorax, initial encounter: Secondary | ICD-10-CM | POA: Diagnosis not present

## 2017-10-16 DIAGNOSIS — Z86010 Personal history of colonic polyps: Secondary | ICD-10-CM | POA: Insufficient documentation

## 2017-11-03 DIAGNOSIS — S32010A Wedge compression fracture of first lumbar vertebra, initial encounter for closed fracture: Secondary | ICD-10-CM | POA: Diagnosis not present

## 2017-11-03 DIAGNOSIS — M5030 Other cervical disc degeneration, unspecified cervical region: Secondary | ICD-10-CM | POA: Diagnosis not present

## 2017-11-03 DIAGNOSIS — S22080A Wedge compression fracture of T11-T12 vertebra, initial encounter for closed fracture: Secondary | ICD-10-CM | POA: Diagnosis not present

## 2017-11-03 DIAGNOSIS — M5135 Other intervertebral disc degeneration, thoracolumbar region: Secondary | ICD-10-CM | POA: Diagnosis not present

## 2017-11-04 ENCOUNTER — Other Ambulatory Visit: Payer: Self-pay | Admitting: Orthopedic Surgery

## 2017-11-04 DIAGNOSIS — S32010A Wedge compression fracture of first lumbar vertebra, initial encounter for closed fracture: Secondary | ICD-10-CM

## 2017-11-04 DIAGNOSIS — S22080A Wedge compression fracture of T11-T12 vertebra, initial encounter for closed fracture: Secondary | ICD-10-CM

## 2017-11-06 ENCOUNTER — Ambulatory Visit
Admission: RE | Admit: 2017-11-06 | Discharge: 2017-11-06 | Disposition: A | Discharge status: Home / Self Care | Payer: BLUE CROSS/BLUE SHIELD | Source: Ambulatory Visit | Attending: Orthopedic Surgery | Admitting: Orthopedic Surgery

## 2017-11-06 DIAGNOSIS — X58XXXA Exposure to other specified factors, initial encounter: Secondary | ICD-10-CM | POA: Insufficient documentation

## 2017-11-06 DIAGNOSIS — S32010A Wedge compression fracture of first lumbar vertebra, initial encounter for closed fracture: Secondary | ICD-10-CM | POA: Insufficient documentation

## 2017-11-06 DIAGNOSIS — M48061 Spinal stenosis, lumbar region without neurogenic claudication: Secondary | ICD-10-CM | POA: Insufficient documentation

## 2017-11-06 DIAGNOSIS — M47814 Spondylosis without myelopathy or radiculopathy, thoracic region: Secondary | ICD-10-CM | POA: Diagnosis not present

## 2017-11-06 DIAGNOSIS — S22080A Wedge compression fracture of T11-T12 vertebra, initial encounter for closed fracture: Secondary | ICD-10-CM

## 2017-11-06 DIAGNOSIS — M5126 Other intervertebral disc displacement, lumbar region: Secondary | ICD-10-CM | POA: Diagnosis not present

## 2017-11-27 DIAGNOSIS — K620 Anal polyp: Secondary | ICD-10-CM | POA: Diagnosis not present

## 2017-11-27 DIAGNOSIS — K621 Rectal polyp: Secondary | ICD-10-CM | POA: Diagnosis not present

## 2017-11-27 DIAGNOSIS — Z800 Family history of malignant neoplasm of digestive organs: Secondary | ICD-10-CM | POA: Diagnosis not present

## 2017-11-27 DIAGNOSIS — Z86010 Personal history of colonic polyps: Secondary | ICD-10-CM | POA: Diagnosis not present

## 2017-11-27 LAB — HM COLONOSCOPY

## 2017-12-22 ENCOUNTER — Encounter: Payer: 59 | Admitting: Family Medicine

## 2018-01-01 ENCOUNTER — Encounter: Payer: Self-pay | Admitting: Family Medicine

## 2018-01-01 ENCOUNTER — Ambulatory Visit (INDEPENDENT_AMBULATORY_CARE_PROVIDER_SITE_OTHER): Payer: BLUE CROSS/BLUE SHIELD | Admitting: Family Medicine

## 2018-01-01 VITALS — BP 118/69 | HR 100 | Ht 68.5 in | Wt 188.0 lb

## 2018-01-01 DIAGNOSIS — M5135 Other intervertebral disc degeneration, thoracolumbar region: Secondary | ICD-10-CM | POA: Diagnosis not present

## 2018-01-01 DIAGNOSIS — Z0000 Encounter for general adult medical examination without abnormal findings: Secondary | ICD-10-CM | POA: Diagnosis not present

## 2018-01-01 DIAGNOSIS — N529 Male erectile dysfunction, unspecified: Secondary | ICD-10-CM

## 2018-01-01 DIAGNOSIS — Z0001 Encounter for general adult medical examination with abnormal findings: Secondary | ICD-10-CM | POA: Diagnosis not present

## 2018-01-01 LAB — URINALYSIS, ROUTINE W REFLEX MICROSCOPIC
BILIRUBIN UA: NEGATIVE
Glucose, UA: NEGATIVE
Ketones, UA: NEGATIVE
LEUKOCYTES UA: NEGATIVE
Nitrite, UA: NEGATIVE
PH UA: 5.5 (ref 5.0–7.5)
Protein, UA: NEGATIVE
RBC UA: NEGATIVE
SPEC GRAV UA: 1.02 (ref 1.005–1.030)
Urobilinogen, Ur: 0.2 mg/dL (ref 0.2–1.0)

## 2018-01-01 MED ORDER — SILDENAFIL CITRATE 20 MG PO TABS: 20.0000 mg | ORAL_TABLET | ORAL | 12 refills | Status: DC | PRN

## 2018-01-01 NOTE — Progress Notes (Signed)
BP 118/69   Pulse 100   Ht 5' 8.5" (1.74 m)   Wt 188 lb (85.3 kg)   SpO2 98%   BMI 28.17 kg/m    Subjective:    Patient ID: Andres Dillon, male    DOB: May 10, 1961, 57 y.o.   MRN: [id number]  HPI: Andres Dillon is a 57 y.o. male  Chief Complaint  Patient presents with  . Annual Exam  Patient had an eventful spring with falling off a ladder in April.  Had extensive x-rays which were reviewed with no significant changes other than multilevel degenerative arthritis of his cervical thoracic and lumbar spine. Reviewed hazards and expense of trying to climb ladders.   Relevant past medical, surgical, family and social history reviewed and updated as indicated. Interim medical history since our last visit reviewed. Allergies and medications reviewed and updated.  Review of Systems  Constitutional: Negative.   HENT: Negative.   Eyes: Negative.   Respiratory: Negative.   Cardiovascular: Negative.   Gastrointestinal: Negative.        Bothered by reflux  Endocrine: Negative.   Genitourinary: Negative.        Mild ED  Musculoskeletal: Negative.   Skin: Negative.   Allergic/Immunologic: Negative.   Neurological: Negative.   Hematological: Negative.   Psychiatric/Behavioral: Negative.     Per HPI unless specifically indicated above     Objective:    BP 118/69   Pulse 100   Ht 5' 8.5" (1.74 m)   Wt 188 lb (85.3 kg)   SpO2 98%   BMI 28.17 kg/m   Wt Readings from Last 3 Encounters:  01/01/18 188 lb (85.3 kg)  08/20/17 191 lb (86.6 kg)  03/19/17 191 lb 8 oz (86.9 kg)    Physical Exam  Constitutional: He is oriented to person, place, and time. He appears well-developed and well-nourished.  HENT:  Head: Normocephalic and atraumatic.  Right Ear: External ear normal.  Left Ear: External ear normal.  Eyes: Pupils are equal, round, and reactive to light. Conjunctivae and EOM are normal.  Neck: Normal range of motion. Neck supple.  Cardiovascular: Normal rate, regular  rhythm, normal heart sounds and intact distal pulses.  Pulmonary/Chest: Effort normal and breath sounds normal.  Abdominal: Soft. Bowel sounds are normal. There is no splenomegaly or hepatomegaly.  Genitourinary: Rectum normal, prostate normal and penis normal.  Musculoskeletal: Normal range of motion.  Neurological: He is alert and oriented to person, place, and time. He has normal reflexes.  Skin: No rash noted. No erythema.  Psychiatric: He has a normal mood and affect. His behavior is normal. Judgment and thought content normal.    Results for orders placed or performed in visit on 03/19/17  EKG 12-Lead  Result Value Ref Range   Result 2        Assessment & Plan:   Problem List Items Addressed This Visit      Musculoskeletal and Integument   DDD (degenerative disc disease), thoracolumbar    Chronic problem and discussed care and tx        Genitourinary   ED (erectile dysfunction)    Trial of viagra       Other Visit Diagnoses    PE (physical exam), annual    -  Primary   Relevant Orders   CBC with Differential/Platelet   Comprehensive metabolic panel   Lipid panel   PSA   TSH   Urinalysis, Routine w reflex microscopic       Follow  up plan: Return in about 1 year (around 01/02/2019) for Physical Exam.

## 2018-01-01 NOTE — Assessment & Plan Note (Signed)
Chronic problem and discussed care and tx

## 2018-01-01 NOTE — Assessment & Plan Note (Signed)
Trial of viagra 

## 2018-01-02 LAB — CBC WITH DIFFERENTIAL/PLATELET
BASOS ABS: 0 10*3/uL (ref 0.0–0.2)
BASOS: 0 %
EOS (ABSOLUTE): 0.2 10*3/uL (ref 0.0–0.4)
Eos: 2 %
Hematocrit: 44.4 % (ref 37.5–51.0)
Hemoglobin: 14.5 g/dL (ref 13.0–17.7)
Immature Grans (Abs): 0 10*3/uL (ref 0.0–0.1)
Immature Granulocytes: 0 %
LYMPHS ABS: 1.5 10*3/uL (ref 0.7–3.1)
Lymphs: 21 %
MCH: 29.9 pg (ref 26.6–33.0)
MCHC: 32.7 g/dL (ref 31.5–35.7)
MCV: 92 fL (ref 79–97)
MONOS ABS: 0.5 10*3/uL (ref 0.1–0.9)
Monocytes: 7 %
NEUTROS ABS: 5 10*3/uL (ref 1.4–7.0)
Neutrophils: 70 %
PLATELETS: 270 10*3/uL (ref 150–450)
RBC: 4.85 x10E6/uL (ref 4.14–5.80)
RDW: 14.2 % (ref 12.3–15.4)
WBC: 7.2 10*3/uL (ref 3.4–10.8)

## 2018-01-02 LAB — COMPREHENSIVE METABOLIC PANEL
ALK PHOS: 66 IU/L (ref 39–117)
ALT: 22 IU/L (ref 0–44)
AST: 22 IU/L (ref 0–40)
Albumin/Globulin Ratio: 2 (ref 1.2–2.2)
Albumin: 4.7 g/dL (ref 3.5–5.5)
BILIRUBIN TOTAL: 0.6 mg/dL (ref 0.0–1.2)
BUN/Creatinine Ratio: 10 (ref 9–20)
BUN: 13 mg/dL (ref 6–24)
CHLORIDE: 104 mmol/L (ref 96–106)
CO2: 21 mmol/L (ref 20–29)
Calcium: 9.9 mg/dL (ref 8.7–10.2)
Creatinine, Ser: 1.26 mg/dL (ref 0.76–1.27)
GFR calc non Af Amer: 63 mL/min/{1.73_m2} (ref >59)
GFR, EST AFRICAN AMERICAN: 73 mL/min/{1.73_m2} (ref >59)
GLUCOSE: 82 mg/dL (ref 65–99)
Globulin, Total: 2.3 g/dL (ref 1.5–4.5)
Potassium: 4.4 mmol/L (ref 3.5–5.2)
Sodium: 144 mmol/L (ref 134–144)
TOTAL PROTEIN: 7 g/dL (ref 6.0–8.5)

## 2018-01-02 LAB — LIPID PANEL
CHOLESTEROL TOTAL: 219 mg/dL — AB (ref 100–199)
Chol/HDL Ratio: 4.8 ratio (ref 0.0–5.0)
HDL: 46 mg/dL (ref >39)
LDL Calculated: 135 mg/dL — ABNORMAL HIGH (ref 0–99)
Triglycerides: 192 mg/dL — ABNORMAL HIGH (ref 0–149)
VLDL Cholesterol Cal: 38 mg/dL (ref 5–40)

## 2018-01-02 LAB — PSA: PROSTATE SPECIFIC AG, SERUM: 2.8 ng/mL (ref 0.0–4.0)

## 2018-01-02 LAB — TSH: TSH: 1.18 u[IU]/mL (ref 0.450–4.500)

## 2018-01-05 ENCOUNTER — Encounter: Payer: Self-pay | Admitting: Family Medicine

## 2018-06-10 IMAGING — MR MR LUMBAR SPINE W/O CM
9 of 11 series · 39 of 48 positions shown · non-contrast
Comparison: Chest radiograph August 19, 2012.

CLINICAL DATA: Generalized back pain, fell from ladder September 15, 2017. Remote history of spine surgery and T12, L1 fractures.

EXAM:
MRI THORACIC AND LUMBAR SPINE WITHOUT CONTRAST
TECHNIQUE: Multiplanar and multiecho pulse sequences of the thoracic and lumbar
spine were obtained without intravenous contrast.

[Series 4: T2 · sagittal · 4.0mm · 1.33mm/px · 3 of 15 slices shown (1 of 4)]
[im 1/15]
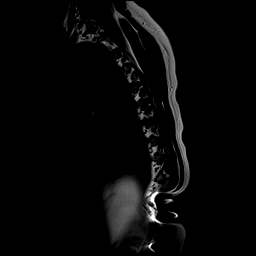
[im 8/15]
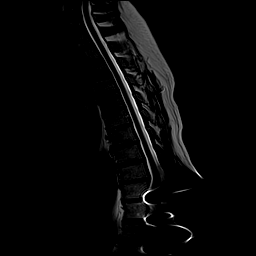
[im 15/15]
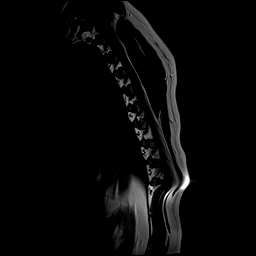

[Series 5: T1 · sagittal · 4.0mm · 1.33mm/px · 3 of 15 slices shown (1 of 3)]
[im 1/15]
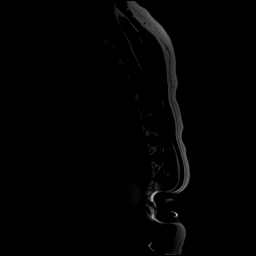
[im 8/15]
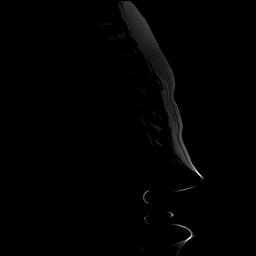
[im 15/15]
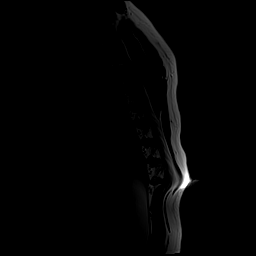

[Series 6: STIR · sagittal · 4.0mm · 1.33mm/px · 3 of 15 slices shown (1 of 2)]
[im 1/15]
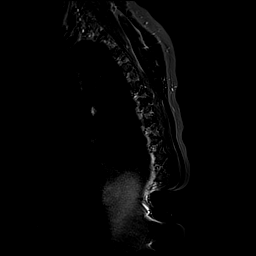
[im 8/15]
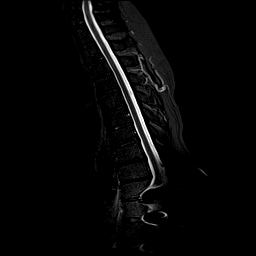
[im 15/15]
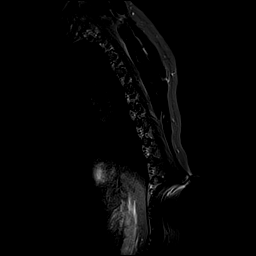

[Series 7: T2 · axial · 5.0mm · 0.86mm/px · z∈[-344,-109]mm · 7 of 40 slices shown (2 of 4)]
[im 1/40]
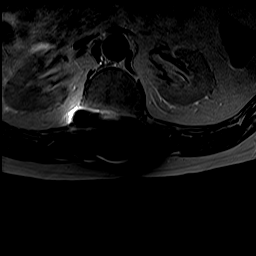
[im 7/40]
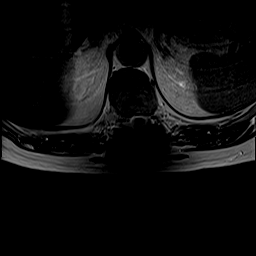
[im 14/40]
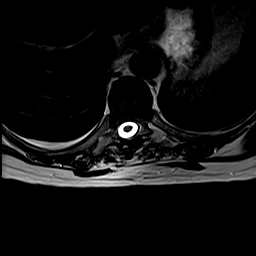
[im 20/40]
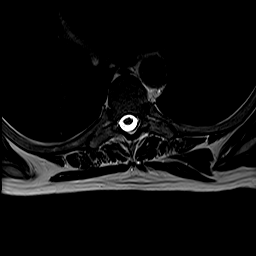
[im 27/40]
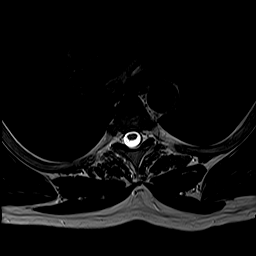
[im 33/40]
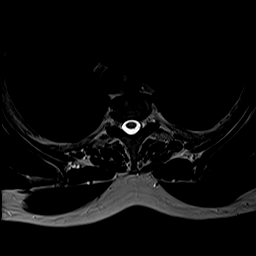
[im 40/40]
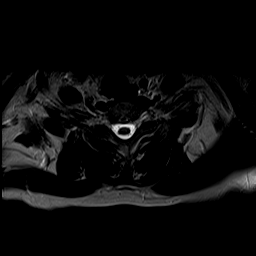

[Series 10: T2 · sagittal · 4.0mm · 1.02mm/px · 3 of 17 slices shown (3 of 4)]
[im 1/17]
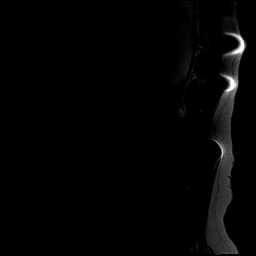
[im 9/17]
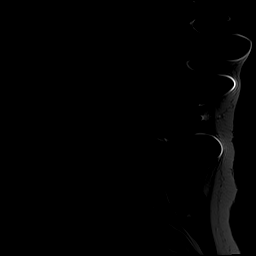
[im 17/17]
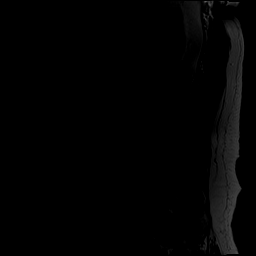

[Series 11: T1 · sagittal · 4.0mm · 1.02mm/px · 3 of 17 slices shown (2 of 3)]
[im 1/17]
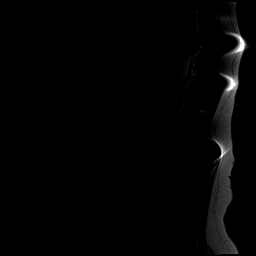
[im 9/17]
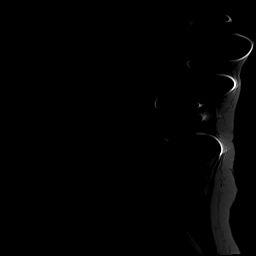
[im 17/17]
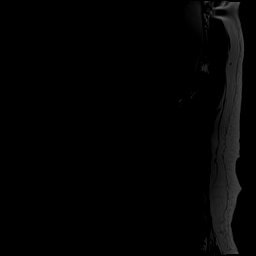

[Series 12: STIR · sagittal · 4.0mm · 1.02mm/px · 3 of 17 slices shown (2 of 2)]
[im 1/17]
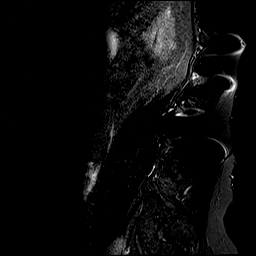
[im 9/17]
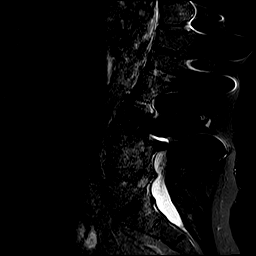
[im 17/17]
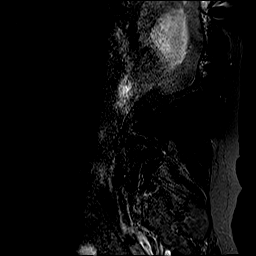

[Series 13: T2 · axial · 4.0mm · 0.78mm/px · z∈[-515,-310]mm · 7 of 39 slices shown (4 of 4)]
[im 1/39]
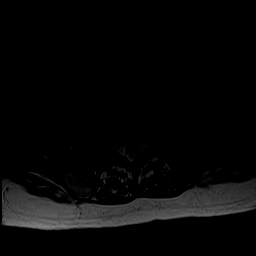
[im 7/39]
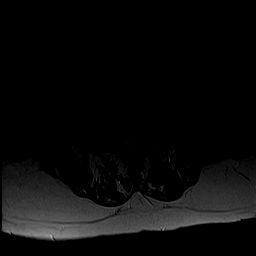
[im 13/39]
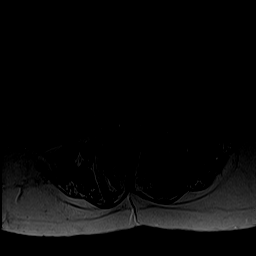
[im 20/39]
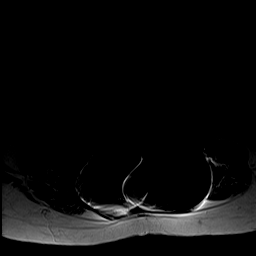
[im 26/39]
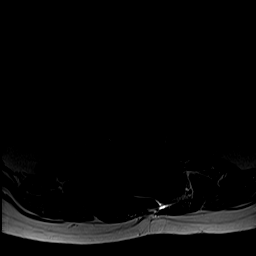
[im 32/39]
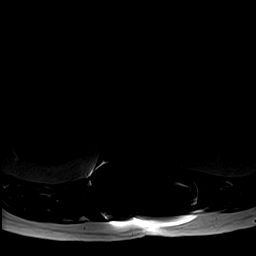
[im 39/39]
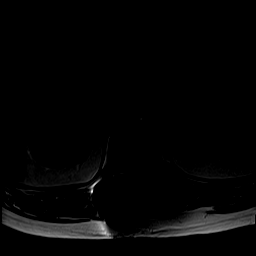

[Series 14: T1 · axial · 4.0mm · 0.39mm/px · z∈[-515,-310]mm · 7 of 39 slices shown (3 of 3)]
[im 1/39]
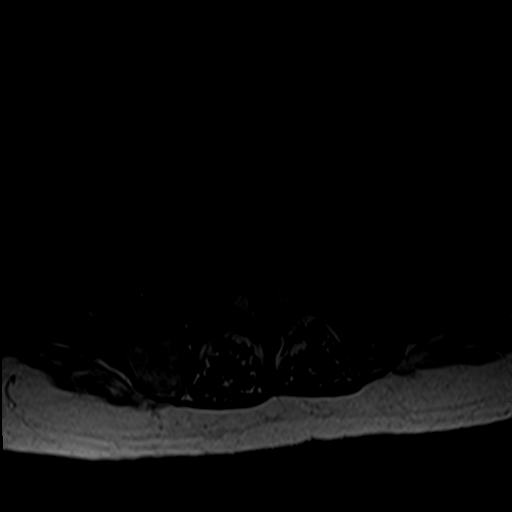
[im 7/39]
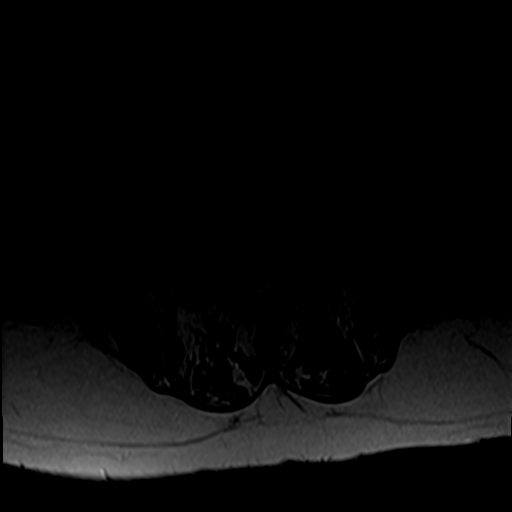
[im 13/39]
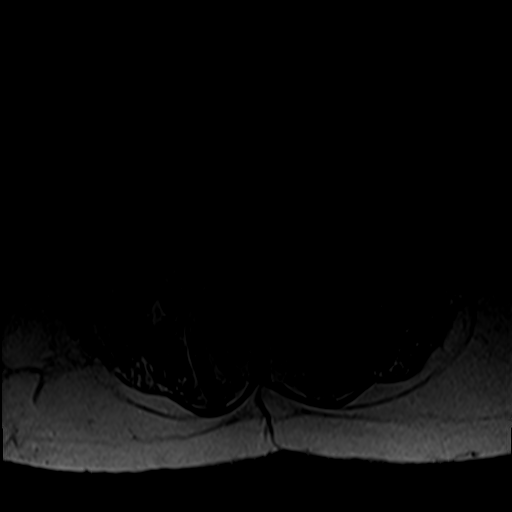
[im 20/39]
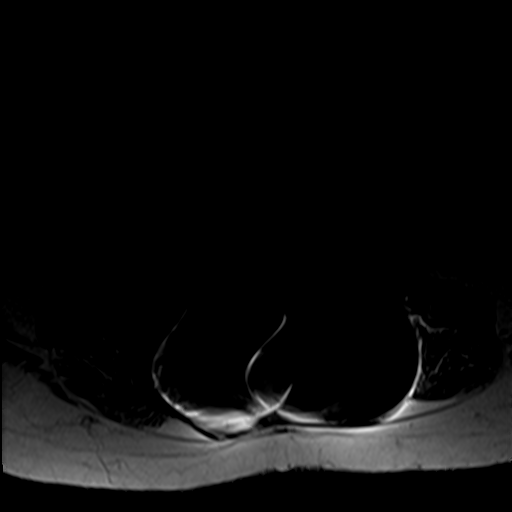
[im 26/39]
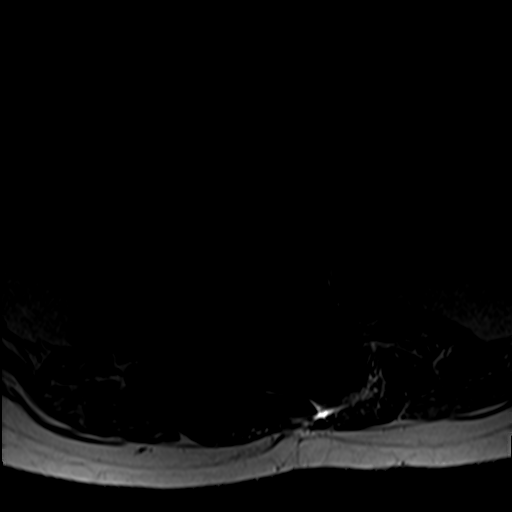
[im 32/39]
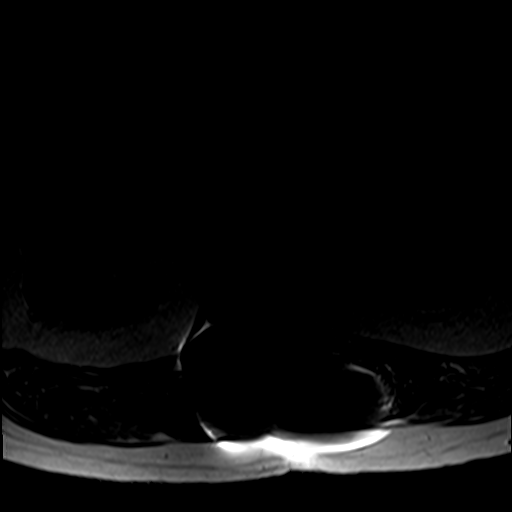
[im 39/39]
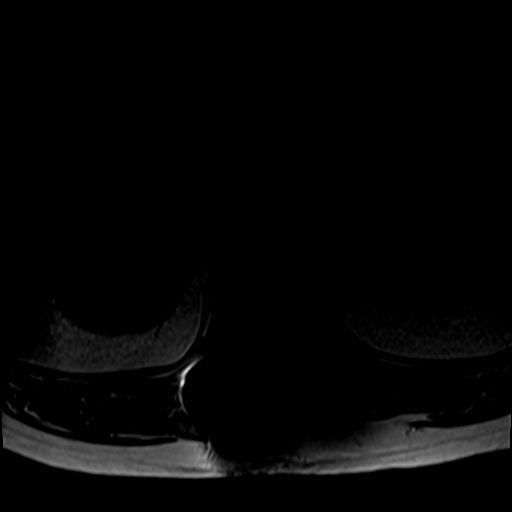

[39 of 48 positions shown; findings below may reference images not displayed]

Lumbar spine
radiograph report dated November 28, 1997 though images are not available
for direct comparison.
FINDINGS: MRI THORACIC SPINE FINDINGS

ALIGNMENT: Evaluation of lower thoracic spine obscured by hardware
artifact from approximately T10 caudally. Straightened thoracic
kyphosis.

VERTEBRAE/DISCS: Vertebral bodies are intact. Moderate T9-10 disc
height loss with mild chronic discogenic endplate changes, possibly
overestimated by hardware artifact. Multilevel mild disc desiccation
mild subacute discogenic endplate changes. Minimal acute discogenic
endplate changes versus Schmorl's node T9-10. No suspicious bone
marrow signal.

CORD: Thoracic spinal cord is normal morphology and signal
characteristics to the level of T9-10, obscured caudally.

PREVERTEBRAL AND PARASPINAL SOFT TISSUES: Normal though obscured at
thoracolumbar junction.

DISC LEVELS:

No disc bulge, canal stenosis nor neural foraminal narrowing through
T9-10, obscured caudally.

MRI LUMBAR SPINE FINDINGS

SEGMENTATION: For the purposes of this report, the last well-formed
intervertebral disc is reported as L5-S1.

ALIGNMENT: Maintained lumbar lordosis. No malalignment lower lumbar
spine.

VERTEBRAE:Evaluation of upper lumbar spine obscured by hardware
artifact. Thoracolumbar posterior instrumentation to the level of L3
markedly limiting evaluation. Old moderate L1 and mild to moderate
T12 compression fractures. Remaining lumbar vertebral bodies intact.
Moderate T12-L1, L5-S1 disc height loss with generalized disc
desiccation compatible with degenerative discs. Mild subacute
discogenic endplate changes L5. Scattered old Schmorl's node. No
suspicious or acute bone marrow signal.

CONUS MEDULLARIS AND CAUDA EQUINA: Conus medullaris and proximal
cauda equina obscured. No nerve root clumping lower lumbar spine.

PARASPINAL AND OTHER SOFT TISSUES: Mild paraspinal muscle atrophy.

DISC LEVELS:

T12-L1 through L2-3: Obscured.

L3-4: Moderate broad-based disc bulge, mild facet arthropathy.
Likely severe canal stenosis. Neural foramen obscured.

L4-5: 6 mm LEFT central disc protrusion with bright STIR signal
suggesting more acute process effaces the LEFT lateral recess,
posteriorly displacing the traversing LEFT L5 nerve. Small
broad-based disc bulge. Mild facet arthropathy. Moderate canal
stenosis. Mild LEFT neural foraminal narrowing.

L5-S1: Moderate broad-based disc bulge. Moderate facet arthropathy
and ligamentum flavum redundancy without canal stenosis. Mild RIGHT,
mild to moderate LEFT neural foraminal narrowing.
IMPRESSION: MR THORACIC SPINE IMPRESSION

1. Thoracolumbar spine hardware obscures lower thoracic spine.
2. No acute fracture or malalignment.
3. Mild degenerative change without canal stenosis or neural
foraminal narrowing to level T9-10, obscured caudally.

MR LUMBAR SPINE IMPRESSION

1. Thoracolumbar spine hardware obscures upper lumbar spine.
2. No acute fracture or malalignment. Old moderate L1 and mild to
moderate T12 compression fractures.
3. Severe suspected canal stenosis L3-4. Moderate canal stenosis
L4-5.
4. L4-5 and L5-S1 neural foraminal narrowing (neural foramen
obscured from T12-L1 through L3-4): Mild to moderate on the LEFT at
L5-S1.

## 2018-07-20 ENCOUNTER — Encounter: Payer: Self-pay | Admitting: Family Medicine

## 2018-07-20 ENCOUNTER — Telehealth: Payer: Self-pay | Admitting: Family Medicine

## 2018-07-20 ENCOUNTER — Ambulatory Visit: Payer: BLUE CROSS/BLUE SHIELD | Admitting: Family Medicine

## 2018-07-20 ENCOUNTER — Ambulatory Visit: Payer: Self-pay

## 2018-07-20 VITALS — BP 151/91 | HR 117 | Temp 100.3°F | Ht 68.5 in | Wt 192.4 lb

## 2018-07-20 DIAGNOSIS — J111 Influenza due to unidentified influenza virus with other respiratory manifestations: Secondary | ICD-10-CM

## 2018-07-20 DIAGNOSIS — R509 Fever, unspecified: Secondary | ICD-10-CM | POA: Diagnosis not present

## 2018-07-20 LAB — VERITOR FLU A/B WAIVED
INFLUENZA A: NEGATIVE
Influenza B: NEGATIVE

## 2018-07-20 MED ORDER — PREDNISONE 10 MG PO TABS: ORAL_TABLET | ORAL | 0 refills | Status: DC

## 2018-07-20 MED ORDER — OSELTAMIVIR PHOSPHATE 75 MG PO CAPS: 75.0000 mg | ORAL_CAPSULE | Freq: Two times a day (BID) | ORAL | 0 refills | Status: DC

## 2018-07-20 MED ORDER — HYDROCOD POLST-CPM POLST ER 10-8 MG/5ML PO SUER: 5.0000 mL | Freq: Every evening | ORAL | 0 refills | Status: DC | PRN

## 2018-07-20 NOTE — Telephone Encounter (Signed)
Copied from CRM [id number]. Topic: Quick Communication - See Telephone Encounter >> Jul 20, 2018 10:48 AM Arlyss Gandy, NT wrote: CRM for notification. See Telephone encounter for: 07/20/18. Pt spoke to triage and made an appt for tomorrow. Pt is wanting to see if Tamiflu can be sent in to his pharmacy to help get control of his symptoms today.

## 2018-07-20 NOTE — Progress Notes (Signed)
BP (!) 151/91 (BP Location: Left Arm, Patient Position: Sitting, Cuff Size: Normal)   Pulse (!) 117   Temp 100.3 F (37.9 C)   Ht 5' 8.5" (1.74 m)   Wt 192 lb 6 oz (87.3 kg)   SpO2 98%   BMI 28.83 kg/m    Subjective:    Patient ID: Andres Dillon, male    DOB: 07/07/1960, 58 y.o.   MRN: [id number]  HPI: Andres PlyDavid C Gikas is a 58 y.o. male  Chief Complaint  Patient presents with  . Fever   Fever, chills, generalized body aches, hacking cough, nasal congestion started suddenly yesterday. Denies CP, N/V/D, ear pain. Not taking anything for sxs currently other than took one or two doses of an antibiotic he had leftover at home from something else. States his girlfriend had the flu recently. No hx of pulmonary dz.   Relevant past medical, surgical, family and social history reviewed and updated as indicated. Interim medical history since our last visit reviewed. Allergies and medications reviewed and updated.  Review of Systems  Per HPI unless specifically indicated above     Objective:    BP (!) 151/91 (BP Location: Left Arm, Patient Position: Sitting, Cuff Size: Normal)   Pulse (!) 117   Temp 100.3 F (37.9 C)   Ht 5' 8.5" (1.74 m)   Wt 192 lb 6 oz (87.3 kg)   SpO2 98%   BMI 28.83 kg/m   Wt Readings from Last 3 Encounters:  07/20/18 192 lb 6 oz (87.3 kg)  01/01/18 188 lb (85.3 kg)  08/20/17 191 lb (86.6 kg)    Physical Exam Vitals signs and nursing note reviewed.  Constitutional:      Appearance: He is well-developed.  HENT:     Head: Atraumatic.     Right Ear: External ear normal.     Left Ear: External ear normal.     Nose: Congestion present.     Mouth/Throat:     Pharynx: Posterior oropharyngeal erythema present. No oropharyngeal exudate.  Eyes:     Conjunctiva/sclera: Conjunctivae normal.     Pupils: Pupils are equal, round, and reactive to light.  Neck:     Musculoskeletal: Normal range of motion and neck supple.  Cardiovascular:     Rate and Rhythm:  Regular rhythm. Tachycardia present.  Pulmonary:     Effort: Pulmonary effort is normal. No respiratory distress.     Breath sounds: No wheezing or rales.  Musculoskeletal: Normal range of motion.  Lymphadenopathy:     Cervical: No cervical adenopathy.  Skin:    General: Skin is warm and dry.  Neurological:     Mental Status: He is alert and oriented to person, place, and time.  Psychiatric:        Behavior: Behavior normal.     Results for orders placed or performed in visit on 01/01/18  CBC with Differential/Platelet  Result Value Ref Range   WBC 7.2 3.4 - 10.8 x10E3/uL   RBC 4.85 4.14 - 5.80 x10E6/uL   Hemoglobin 14.5 13.0 - 17.7 g/dL   Hematocrit 40.944.4 81.137.5 - 51.0 %   MCV 92 79 - 97 fL   MCH 29.9 26.6 - 33.0 pg   MCHC 32.7 31.5 - 35.7 g/dL   RDW 91.414.2 78.212.3 - 95.615.4 %   Platelets 270 150 - 450 x10E3/uL   Neutrophils 70 Not Estab. %   Lymphs 21 Not Estab. %   Monocytes 7 Not Estab. %   Eos 2 Not  Estab. %   Basos 0 Not Estab. %   Neutrophils Absolute 5.0 1.4 - 7.0 x10E3/uL   Lymphocytes Absolute 1.5 0.7 - 3.1 x10E3/uL   Monocytes Absolute 0.5 0.1 - 0.9 x10E3/uL   EOS (ABSOLUTE) 0.2 0.0 - 0.4 x10E3/uL   Basophils Absolute 0.0 0.0 - 0.2 x10E3/uL   Immature Granulocytes 0 Not Estab. %   Immature Grans (Abs) 0.0 0.0 - 0.1 x10E3/uL  Comprehensive metabolic panel  Result Value Ref Range   Glucose 82 65 - 99 mg/dL   BUN 13 6 - 24 mg/dL   Creatinine, Ser 4.48 0.76 - 1.27 mg/dL   GFR calc non Af Amer 63 >59 mL/min/1.73   GFR calc Af Amer 73 >59 mL/min/1.73   BUN/Creatinine Ratio 10 9 - 20   Sodium 144 134 - 144 mmol/L   Potassium 4.4 3.5 - 5.2 mmol/L   Chloride 104 96 - 106 mmol/L   CO2 21 20 - 29 mmol/L   Calcium 9.9 8.7 - 10.2 mg/dL   Total Protein 7.0 6.0 - 8.5 g/dL   Albumin 4.7 3.5 - 5.5 g/dL   Globulin, Total 2.3 1.5 - 4.5 g/dL   Albumin/Globulin Ratio 2.0 1.2 - 2.2   Bilirubin Total 0.6 0.0 - 1.2 mg/dL   Alkaline Phosphatase 66 39 - 117 IU/L   AST 22 0 - 40 IU/L     ALT 22 0 - 44 IU/L  Lipid panel  Result Value Ref Range   Cholesterol, Total 219 (H) 100 - 199 mg/dL   Triglycerides 185 (H) 0 - 149 mg/dL   HDL 46 >63 mg/dL   VLDL Cholesterol Cal 38 5 - 40 mg/dL   LDL Calculated 149 (H) 0 - 99 mg/dL   Chol/HDL Ratio 4.8 0.0 - 5.0 ratio  PSA  Result Value Ref Range   Prostate Specific Ag, Serum 2.8 0.0 - 4.0 ng/mL  TSH  Result Value Ref Range   TSH 1.180 0.450 - 4.500 uIU/mL  Urinalysis, Routine w reflex microscopic  Result Value Ref Range   Specific Gravity, UA 1.020 1.005 - 1.030   pH, UA 5.5 5.0 - 7.5   Color, UA Yellow Yellow   Appearance Ur Clear Clear   Leukocytes, UA Negative Negative   Protein, UA Negative Negative/Trace   Glucose, UA Negative Negative   Ketones, UA Negative Negative   RBC, UA Negative Negative   Bilirubin, UA Negative Negative   Urobilinogen, Ur 0.2 0.2 - 1.0 mg/dL   Nitrite, UA Negative Negative      Assessment & Plan:   Problem List Items Addressed This Visit    None    Visit Diagnoses    Influenza    -  Primary   Rapid flu neg, but sxs consistent. Tx with tamiflu, prednisone, and tussionex at bedtime. Supportive care and sedation precautions reviewed   Relevant Medications   oseltamivir (TAMIFLU) 75 MG capsule   Other Relevant Orders   Veritor Flu A/B Waived       Follow up plan: Return if symptoms worsen or fail to improve.

## 2018-07-20 NOTE — Telephone Encounter (Signed)
Pt worked in today

## 2018-07-20 NOTE — Telephone Encounter (Signed)
Pt called and said he started to feel bad yesterday. He got up this am and has a fever of 100.4.  He has a cough. Pt states his girlfriend has the same symptoms. His cough feels tight. He denies other cold symptoms.  He states he is chilled and achy.  He said he took an old antibiotic Sulfamethoxazole.  He states that he thinks it was given for bronchitis. Pt was advised that this is not a good practice. He verbalized understanding. Appointment scheduled per protocol. Care advice read to patient. Pt verbalized understanding of all instructions  Reason for Disposition . Fever present > 3 days (72 hours)  Answer Assessment - Initial Assessment Questions 1. TEMPERATURE: "What is the most recent temperature?"  "How was it measured?"      100.4 2. ONSET: "When did the fever start?"     This AM 3. SYMPTOMS: "Do you have any other symptoms besides the fever?"  (e.g., colds, headache, sore throat, earache, cough, rash, diarrhea, vomiting, abdominal pain)     Cough sore throat chills 4. CAUSE: If there are no symptoms, ask: "What do you think is causing the fever?"      cough 5. CONTACTS: "Does anyone else in the family have an infection?"     girfriend 6. TREATMENT: "What have you done so far to treat this fever?" (e.g., medications)     nothing 7. IMMUNOCOMPROMISE: "Do you have of the following: diabetes, HIV positive, splenectomy, cancer chemotherapy, chronic steroid treatment, transplant patient, etc."     no 8. PREGNANCY: "Is there any chance you are pregnant?" "When was your last menstrual period?"     n/a 9. TRAVEL: "Have you traveled out of the country in the last month?" (e.g., travel history, exposures)    no  Protocols used: FEVER-A-AH

## 2018-07-21 ENCOUNTER — Ambulatory Visit: Payer: Self-pay | Admitting: Family Medicine

## 2018-07-22 ENCOUNTER — Telehealth: Payer: Self-pay | Admitting: Family Medicine

## 2018-07-22 MED ORDER — OSELTAMIVIR PHOSPHATE 75 MG PO CAPS: 75.0000 mg | ORAL_CAPSULE | Freq: Two times a day (BID) | ORAL | 0 refills | Status: DC

## 2018-07-22 NOTE — Telephone Encounter (Signed)
Copied from CRM [phone]#[id number]. Topic: General - Other >> Jul 22, 2018  8:54 AM Jaquita Rectoravis, Karen A wrote: Reason for CRM: Patient called to say that he picked up  oseltamivir (TAMIFLU) 75 MG capsule on 07/20/2018 but seem to have misplaced the bottle. Patient asking if Dr could send a new Rx to the pharmacy again please. Please advise  Ph# [phone] >> Jul 22, 2018  9:45 AM Landis Martinsurtis, Karen E wrote: Please advise.  Thank you

## 2018-09-08 DIAGNOSIS — M25521 Pain in right elbow: Secondary | ICD-10-CM | POA: Diagnosis not present

## 2018-09-08 DIAGNOSIS — M7711 Lateral epicondylitis, right elbow: Secondary | ICD-10-CM | POA: Diagnosis not present

## 2018-09-08 DIAGNOSIS — G5621 Lesion of ulnar nerve, right upper limb: Secondary | ICD-10-CM | POA: Diagnosis not present

## 2018-09-28 DIAGNOSIS — M2021 Hallux rigidus, right foot: Secondary | ICD-10-CM | POA: Diagnosis not present

## 2018-09-28 DIAGNOSIS — M79674 Pain in right toe(s): Secondary | ICD-10-CM | POA: Diagnosis not present

## 2018-09-28 DIAGNOSIS — M2022 Hallux rigidus, left foot: Secondary | ICD-10-CM | POA: Diagnosis not present

## 2018-09-28 DIAGNOSIS — M79675 Pain in left toe(s): Secondary | ICD-10-CM | POA: Diagnosis not present

## 2019-01-05 ENCOUNTER — Encounter: Payer: BLUE CROSS/BLUE SHIELD | Admitting: Family Medicine

## 2019-02-16 ENCOUNTER — Ambulatory Visit: Payer: BLUE CROSS/BLUE SHIELD | Admitting: Nurse Practitioner

## 2019-04-12 ENCOUNTER — Encounter: Payer: BLUE CROSS/BLUE SHIELD | Admitting: Family Medicine

## 2019-05-27 ENCOUNTER — Encounter: Payer: Self-pay | Admitting: Family Medicine

## 2019-05-27 ENCOUNTER — Ambulatory Visit (INDEPENDENT_AMBULATORY_CARE_PROVIDER_SITE_OTHER): Payer: BC Managed Care – PPO | Admitting: Family Medicine

## 2019-05-27 ENCOUNTER — Other Ambulatory Visit: Payer: Self-pay

## 2019-05-27 VITALS — BP 118/79 | HR 84 | Temp 97.8°F | Ht 68.31 in | Wt 183.5 lb

## 2019-05-27 DIAGNOSIS — Z0000 Encounter for general adult medical examination without abnormal findings: Secondary | ICD-10-CM

## 2019-05-27 DIAGNOSIS — N529 Male erectile dysfunction, unspecified: Secondary | ICD-10-CM

## 2019-05-27 DIAGNOSIS — K219 Gastro-esophageal reflux disease without esophagitis: Secondary | ICD-10-CM | POA: Diagnosis not present

## 2019-05-27 LAB — UA/M W/RFLX CULTURE, ROUTINE
Bilirubin, UA: NEGATIVE
Glucose, UA: NEGATIVE
Ketones, UA: NEGATIVE
Leukocytes,UA: NEGATIVE
Nitrite, UA: NEGATIVE
Protein,UA: NEGATIVE
RBC, UA: NEGATIVE
Specific Gravity, UA: 1.015 (ref 1.005–1.030)
Urobilinogen, Ur: 0.2 mg/dL (ref 0.2–1.0)
pH, UA: 5.5 (ref 5.0–7.5)

## 2019-05-27 MED ORDER — SILDENAFIL CITRATE 20 MG PO TABS: 20.0000 mg | ORAL_TABLET | ORAL | 12 refills | Status: DC | PRN

## 2019-05-27 MED ORDER — OMEPRAZOLE 20 MG PO CPDR: 20.0000 mg | DELAYED_RELEASE_CAPSULE | Freq: Every day | ORAL | 3 refills | Status: DC

## 2019-05-27 NOTE — Patient Instructions (Signed)
[facility] Elbow Rehab Ask your health care provider which exercises are safe for you. Do exercises exactly as told by your health care provider and adjust them as directed. It is normal to feel mild stretching, pulling, tightness, or discomfort as you do these exercises. Stop right away if you feel sudden pain or your pain gets worse. Do not begin these exercises until told by your health care provider. Stretching and range-of-motion exercises These exercises warm up your muscles and joints and improve the movement and flexibility of your elbow. These exercises also help to relieve pain, numbness, and tingling. Wrist flexion, assisted  1. Straighten your left / right elbow in front of you with your palm facing down toward the floor. ? If told by your health care provider, bend your left / right elbow to a 90-degree angle (right angle) at your side. 2. With your other hand, gently push over the back of your left / right hand so your fingers point toward the floor (flexion). Stop when you feel a gentle stretch on the back of your forearm. 3. Hold this position for __________ seconds. Repeat __________ times. Complete this exercise __________ times a day. Wrist extension, assisted  1. Straighten your left / right elbow in front of you with your palm facing up toward the ceiling. ? If told by your health care provider, bend your left / right elbow to a 90-degree angle (right angle) at your side. 2. With your other hand, gently pull your left / right hand and fingers toward the floor (extension). Stop when you feel a gentle stretch on the palm side of your forearm. 3. Hold this position for __________ seconds. Repeat __________ times. Complete this exercise __________ times a day. Assisted forearm rotation, supination 1. Sit or stand with your left / right elbow bent to a 90-degree angle (right angle) at your side. 2. Using your uninjured hand, turn (rotate) your left / right palm up toward the ceiling  (supination) until you feel a gentle stretch along the inside of your forearm. 3. Hold this position for __________ seconds. Repeat __________ times. Complete this exercise __________ times a day. Assisted forearm rotation, pronation 1. Sit or stand with your left / right elbow bent to a 90-degree angle (right angle) at your side. 2. Using your uninjured hand, rotate your left / right palm down toward the floor (pronation) until you feel a gentle stretch along the outside of your forearm. 3. Hold this position for __________ seconds. Repeat __________ times. Complete this exercise __________ times a day. Strengthening exercises These exercises build strength and endurance in your forearm and elbow. Endurance is the ability to use your muscles for a long time, even after they get tired. Radial deviation  1. Stand with a __________ weight or a hammer in your left / right hand. Or, sit while holding a rubber exercise band or tubing, with your left / right forearm supported on a table or countertop. ? If you are standing, position your forearm so that your thumb is facing forward. If you are sitting, position your forearm so that the thumb is facing the ceiling. This is the neutral position. 2. Raise your hand upward in front of you so your thumb moves toward the ceiling (radial deviation), or pull up on the rubber tubing. Keep your forearm and elbow still while you move your wrist only. 3. Hold this position for __________ seconds. 4. Slowly return to the starting position. Repeat __________ times. Complete this exercise __________ times   a day. Wrist extension, eccentric 1. Sit with your left / right forearm palm-down and supported on a table or other surface. Let your left / right wrist extend over the edge of the surface. 2. Hold a __________ weight or a piece of exercise band or tubing in your left / right hand. ? If using a rubber exercise band or tubing, hold the other end of the tubing with  your other hand. 3. Use your uninjured hand to move your left / right hand up toward the ceiling. 4. Take your uninjured hand away and slowly return to the starting position using only your left / right hand. Lowering your arm under tension is called eccentric extension. Repeat __________ times. Complete this exercise __________ times a day. Wrist extension Do not do this exercise if it causes pain at the outside of your elbow. Only do this exercise once instructed by your health care provider. 1. Sit with your left / right forearm supported on a table or other surface and your palm turned down toward the floor. Let your left / right wrist extend over the edge of the surface. 2. Hold a __________ weight or a piece of rubber exercise band or tubing. ? If you are using a rubber exercise band or tubing, hold the band or tubing in place with your other hand to provide resistance. 3. Slowly bend your wrist so your hand moves up toward the ceiling (extension). Move only your wrist, keeping your forearm and elbow still. 4. Hold this position for __________ seconds. 5. Slowly return to the starting position. Repeat __________ times. Complete this exercise __________ times a day. Forearm rotation, supination To do this exercise, you will need a lightweight hammer or rubber mallet. 1. Sit with your left / right forearm supported on a table or other surface. Bend your elbow to a 90-degree angle (right angle). Position your forearm so that your palm is facing down toward the floor, with your hand resting over the edge of the table. 2. Hold a hammer in your left / right hand. ? To make this exercise easier, hold the hammer near the head of the hammer. ? To make this exercise harder, hold the hammer near the end of the handle. 3. Without moving your wrist or elbow, slowly rotate your forearm so your palm faces up toward the ceiling (supination). 4. Hold this position for __________ seconds. 5. Slowly return  to the starting position. Repeat __________ times. Complete this exercise __________ times a day. Shoulder blade squeeze 1. Sit in a stable chair or stand with good posture. If you are sitting down, do not let your back touch the back of the chair. 2. Your arms should be at your sides with your elbows bent to a 90-degree angle (right angle). Position your forearms so that your thumbs are facing the ceiling (neutral position). 3. Without lifting your shoulders up, squeeze your shoulder blades tightly together. 4. Hold this position for __________ seconds. 5. Slowly release and return to the starting position. Repeat __________ times. Complete this exercise __________ times a day. This information is not intended to replace advice given to you by your health care provider. Make sure you discuss any questions you have with your health care provider. Document Released: 06/10/2005 Document Revised: 10/01/2018 Document Reviewed: 08/04/2018 [employer] Patient Education  2020 [employer]  

## 2019-05-27 NOTE — Progress Notes (Signed)
BP 118/79   Pulse 84   Temp 97.8 F (36.6 C)   Ht 5' 8.31" (1.735 m)   Wt 183 lb 8 oz (83.2 kg)   SpO2 98%   BMI 27.65 kg/m    Subjective:    Patient ID: Andres Dillon, male    DOB: 07-03-1960, 58 y.o.   MRN: [id number]  HPI: Andres Dillon is a 58 y.o. male presenting on 05/27/2019 for comprehensive medical examination. Current medical complaints include:  GERD GERD control status: uncontrolled- has been going on for >5 years Satisfied with current treatment? no Heartburn frequency: clearing his throat Medication side effects: no  Medication compliance: stable Dysphagia: no Odynophagia:  no Hematemesis: no Blood in stool: no EGD: yes- about 10 years ago  Numerous aches and pains. Has had several falls and has had injuries. Has some arthritis   Interim Problems from his last visit: no  Depression Screen done today and results listed below:  Depression screen [facility] 2/9 05/27/2019 01/01/2018 12/18/2016  Decreased Interest 0 1 0  Down, Depressed, Hopeless 0 1 0  PHQ - 2 Score 0 2 0  Altered sleeping - 1 -  Tired, decreased energy - 2 -  Change in appetite - 0 -  Feeling bad or failure about yourself  - 0 -  Trouble concentrating - 0 -  Moving slowly or fidgety/restless - 0 -  Suicidal thoughts - 0 -  PHQ-9 Score - 5 -    Past Medical History:  Past Medical History:  Diagnosis Date  . Arthritis   . GERD (gastroesophageal reflux disease)     Surgical History:  Past Surgical History:  Procedure Laterality Date  . COLONOSCOPY  2014  . SPINE SURGERY  12/23/1995   fell/ rods and screws T10 /L2 fusions    Medications:  Current Outpatient Medications on File Prior to Visit  Medication Sig  . ibuprofen (ADVIL,MOTRIN) 800 MG tablet Take 1 tablet (800 mg total) by mouth every 8 (eight) hours as needed.   No current facility-administered medications on file prior to visit.     Allergies:  No Known Allergies  Social History:  Social History   Socioeconomic  History  . Marital status: Married    Spouse name: Not on file  . Number of children: Not on file  . Years of education: Not on file  . Highest education level: Not on file  Occupational History  . Not on file  Social Needs  . Financial resource strain: Not on file  . Food insecurity    Worry: Not on file    Inability: Not on file  . Transportation needs    Medical: Not on file    Non-medical: Not on file  Tobacco Use  . Smoking status: Never Smoker  . Smokeless tobacco: Never Used  Substance and Sexual Activity  . Alcohol use: Yes    Alcohol/week: 12.0 standard drinks    Types: 12 Cans of beer per week  . Drug use: No  . Sexual activity: Not on file  Lifestyle  . Physical activity    Days per week: Not on file    Minutes per session: Not on file  . Stress: Not on file  Relationships  . Social Herbalist on phone: Not on file    Gets together: Not on file    Attends religious service: Not on file    Active member of club or organization: Not on file  Attends meetings of clubs or organizations: Not on file    Relationship status: Not on file  . Intimate partner violence    Fear of current or ex partner: Not on file    Emotionally abused: Not on file    Physically abused: Not on file    Forced sexual activity: Not on file  Other Topics Concern  . Not on file  Social History Narrative  . Not on file   Social History   Tobacco Use  Smoking Status Never Smoker  Smokeless Tobacco Never Used   Social History   Substance and Sexual Activity  Alcohol Use Yes  . Alcohol/week: 12.0 standard drinks  . Types: 12 Cans of beer per week    Family History:  Family History  Problem Relation Age of Onset  . Cancer Mother   . Cancer Father   . Heart disease Father 49       CABG   . Kidney disease Father     Past medical history, surgical history, medications, allergies, family history and social history reviewed with patient today and changes made to  appropriate areas of the chart.   Review of Systems  Constitutional: Negative.   HENT: Positive for sore throat. Negative for congestion, ear discharge, ear pain, hearing loss, nosebleeds, sinus pain and tinnitus.   Eyes: Positive for blurred vision. Negative for double vision, photophobia, pain, discharge and redness.  Respiratory: Negative.  Negative for stridor.   Cardiovascular: Negative.   Gastrointestinal: Positive for heartburn. Negative for abdominal pain, blood in stool, constipation, diarrhea, melena, nausea and vomiting.  Genitourinary: Negative.        +ED  Musculoskeletal: Positive for back pain, joint pain, myalgias and neck pain. Negative for falls.  Skin: Negative.   Neurological: Negative.   Endo/Heme/Allergies: Negative.   Psychiatric/Behavioral: Negative.     All other ROS negative except what is listed above and in the HPI.      Objective:    BP 118/79   Pulse 84   Temp 97.8 F (36.6 C)   Ht 5' 8.31" (1.735 m)   Wt 183 lb 8 oz (83.2 kg)   SpO2 98%   BMI 27.65 kg/m   Wt Readings from Last 3 Encounters:  05/27/19 183 lb 8 oz (83.2 kg)  07/20/18 192 lb 6 oz (87.3 kg)  01/01/18 188 lb (85.3 kg)    Physical Exam Vitals signs and nursing note reviewed.  Constitutional:      General: He is not in acute distress.    Appearance: Normal appearance. He is obese. He is not ill-appearing, toxic-appearing or diaphoretic.  HENT:     Head: Normocephalic and atraumatic.     Right Ear: Tympanic membrane, ear canal and external ear normal. There is no impacted cerumen.     Left Ear: Tympanic membrane, ear canal and external ear normal. There is no impacted cerumen.     Nose: Nose normal. No congestion or rhinorrhea.     Mouth/Throat:     Mouth: Mucous membranes are moist.     Pharynx: Oropharynx is clear. No oropharyngeal exudate or posterior oropharyngeal erythema.  Eyes:     General: No scleral icterus.       Right eye: No discharge.        Left eye: No  discharge.     Extraocular Movements: Extraocular movements intact.     Conjunctiva/sclera: Conjunctivae normal.     Pupils: Pupils are equal, round, and reactive to light.  Neck:  Musculoskeletal: Normal range of motion and neck supple. No neck rigidity or muscular tenderness.     Vascular: No carotid bruit.  Cardiovascular:     Rate and Rhythm: Normal rate and regular rhythm.     Pulses: Normal pulses.     Heart sounds: No murmur. No friction rub. No gallop.   Pulmonary:     Effort: Pulmonary effort is normal. No respiratory distress.     Breath sounds: Normal breath sounds. No stridor. No wheezing, rhonchi or rales.  Chest:     Chest wall: No tenderness.  Abdominal:     General: Abdomen is flat. Bowel sounds are normal. There is no distension.     Palpations: Abdomen is soft. There is no mass.     Tenderness: There is no abdominal tenderness. There is no right CVA tenderness, left CVA tenderness, guarding or rebound.     Hernia: No hernia is present.  Genitourinary:    Comments: Genital exam deferred with shared decision making Musculoskeletal:        General: No swelling, tenderness, deformity or signs of injury.     Right lower leg: No edema.     Left lower leg: No edema.  Lymphadenopathy:     Cervical: No cervical adenopathy.  Skin:    General: Skin is warm and dry.     Capillary Refill: Capillary refill takes less than 2 seconds.     Coloration: Skin is not jaundiced or pale.     Findings: No bruising, erythema, lesion or rash.  Neurological:     General: No focal deficit present.     Mental Status: He is alert and oriented to person, place, and time.     Cranial Nerves: No cranial nerve deficit.     Sensory: No sensory deficit.     Motor: No weakness.     Coordination: Coordination normal.     Gait: Gait normal.     Deep Tendon Reflexes: Reflexes normal.  Psychiatric:        Mood and Affect: Mood normal.        Behavior: Behavior normal.        Thought  Content: Thought content normal.        Judgment: Judgment normal.     Results for orders placed or performed in visit on 07/20/18  Veritor Flu A/B Waived  Result Value Ref Range   Influenza A Negative Negative   Influenza B Negative Negative      Assessment & Plan:   Problem List Items Addressed This Visit      Other   ED (erectile dysfunction)    Other Visit Diagnoses    Routine general medical examination at a health care facility    -  Primary   Vaccines up to date/declined. Screening labs checked today. Colonoscopy up to date. Continue diet and exercise. Call with any conerns.    Relevant Orders   CBC with Differential OUT   Comp Met (CMET)   Lipid Panel w/o Chol/HDL Ratio OUT   PSA   TSH   UA/M w/rflx Culture, Routine   Gastroesophageal reflux disease, unspecified whether esophagitis present       Will start omeprazole. Call with any concerns. Continue to monitor.    Relevant Medications   omeprazole (PRILOSEC) 20 MG capsule      LABORATORY TESTING:  Health maintenance labs ordered today as discussed above.   The natural history of prostate cancer and ongoing controversy regarding screening and potential treatment outcomes of  prostate cancer has been discussed with the patient. The meaning of a false positive PSA and a false negative PSA has been discussed. He indicates understanding of the limitations of this screening test and wishes to proceed with screening PSA testing.   IMMUNIZATIONS:   - Tdap: Tetanus vaccination status reviewed: last tetanus booster within 10 years. - Influenza: Refused - Pneumovax: Not applicable - Prevnar: Not applicable  SCREENING: - Colonoscopy: Up to date  Discussed with patient purpose of the colonoscopy is to detect colon cancer at curable precancerous or early stages   PATIENT COUNSELING:    Sexuality: Discussed sexually transmitted diseases, partner selection, use of condoms, avoidance of unintended pregnancy  and  contraceptive alternatives.   Advised to avoid cigarette smoking.  I discussed with the patient that most people either abstain from alcohol or drink within safe limits (<=14/week and <=4 drinks/occasion for males, <=7/weeks and <= 3 drinks/occasion for females) and that the risk for alcohol disorders and other health effects rises proportionally with the number of drinks per week and how often a drinker exceeds daily limits.  Discussed cessation/primary prevention of drug use and availability of treatment for abuse.   Diet: Encouraged to adjust caloric intake to maintain  or achieve ideal body weight, to reduce intake of dietary saturated fat and total fat, to limit sodium intake by avoiding high sodium foods and not adding table salt, and to maintain adequate dietary potassium and calcium preferably from fresh fruits, vegetables, and low-fat dairy products.    stressed the importance of regular exercise  Injury prevention: Discussed safety belts, safety helmets, smoke detector, smoking near bedding or upholstery.   Dental health: Discussed importance of regular tooth brushing, flossing, and dental visits.   Follow up plan: NEXT PREVENTATIVE PHYSICAL DUE IN 1 YEAR. Return in about 1 year (around 05/26/2020).

## 2019-05-28 LAB — COMPREHENSIVE METABOLIC PANEL
ALT: 29 IU/L (ref 0–44)
AST: 22 IU/L (ref 0–40)
Albumin/Globulin Ratio: 2.1 (ref 1.2–2.2)
Albumin: 4.6 g/dL (ref 3.8–4.9)
Alkaline Phosphatase: 62 IU/L (ref 39–117)
BUN/Creatinine Ratio: 16 (ref 9–20)
BUN: 16 mg/dL (ref 6–24)
Bilirubin Total: 0.8 mg/dL (ref 0.0–1.2)
CO2: 22 mmol/L (ref 20–29)
Calcium: 9.7 mg/dL (ref 8.7–10.2)
Chloride: 103 mmol/L (ref 96–106)
Creatinine, Ser: 1.01 mg/dL (ref 0.76–1.27)
GFR calc Af Amer: 94 mL/min/{1.73_m2} (ref >59)
GFR calc non Af Amer: 82 mL/min/{1.73_m2} (ref >59)
Globulin, Total: 2.2 g/dL (ref 1.5–4.5)
Glucose: 90 mg/dL (ref 65–99)
Potassium: 4.2 mmol/L (ref 3.5–5.2)
Sodium: 143 mmol/L (ref 134–144)
Total Protein: 6.8 g/dL (ref 6.0–8.5)

## 2019-05-28 LAB — CBC WITH DIFFERENTIAL/PLATELET
Basophils Absolute: 0 10*3/uL (ref 0.0–0.2)
Basos: 0 %
EOS (ABSOLUTE): 0.1 10*3/uL (ref 0.0–0.4)
Eos: 1 %
Hematocrit: 45.1 % (ref 37.5–51.0)
Hemoglobin: 15.1 g/dL (ref 13.0–17.7)
Immature Grans (Abs): 0 10*3/uL (ref 0.0–0.1)
Immature Granulocytes: 0 %
Lymphocytes Absolute: 1.5 10*3/uL (ref 0.7–3.1)
Lymphs: 22 %
MCH: 29.8 pg (ref 26.6–33.0)
MCHC: 33.5 g/dL (ref 31.5–35.7)
MCV: 89 fL (ref 79–97)
Monocytes Absolute: 0.5 10*3/uL (ref 0.1–0.9)
Monocytes: 7 %
Neutrophils Absolute: 4.8 10*3/uL (ref 1.4–7.0)
Neutrophils: 70 %
Platelets: 250 10*3/uL (ref 150–450)
RBC: 5.06 x10E6/uL (ref 4.14–5.80)
RDW: 13.1 % (ref 11.6–15.4)
WBC: 7 10*3/uL (ref 3.4–10.8)

## 2019-05-28 LAB — LIPID PANEL W/O CHOL/HDL RATIO
Cholesterol, Total: 235 mg/dL — ABNORMAL HIGH (ref 100–199)
HDL: 47 mg/dL (ref >39)
LDL Chol Calc (NIH): 162 mg/dL — ABNORMAL HIGH (ref 0–99)
Triglycerides: 145 mg/dL (ref 0–149)
VLDL Cholesterol Cal: 26 mg/dL (ref 5–40)

## 2019-05-28 LAB — TSH: TSH: 1.04 u[IU]/mL (ref 0.450–4.500)

## 2019-05-28 LAB — PSA: Prostate Specific Ag, Serum: 2.6 ng/mL (ref 0.0–4.0)

## 2019-06-02 ENCOUNTER — Ambulatory Visit (INDEPENDENT_AMBULATORY_CARE_PROVIDER_SITE_OTHER): Payer: BC Managed Care – PPO | Admitting: Family Medicine

## 2019-06-02 ENCOUNTER — Other Ambulatory Visit: Payer: Self-pay

## 2019-06-02 ENCOUNTER — Encounter: Payer: Self-pay | Admitting: Family Medicine

## 2019-06-02 VITALS — Temp 97.8°F

## 2019-06-02 DIAGNOSIS — J029 Acute pharyngitis, unspecified: Secondary | ICD-10-CM

## 2019-06-02 MED ORDER — PREDNISONE 50 MG PO TABS: 50.0000 mg | ORAL_TABLET | Freq: Every day | ORAL | 0 refills | Status: DC

## 2019-06-02 NOTE — Progress Notes (Signed)
Temp 97.8 F (36.6 C)    Subjective:    Patient ID: Andres Dillon, male    DOB: August 11, 1960, 58 y.o.   MRN: [id number]  HPI: Andres Dillon is a 58 y.o. male  Chief Complaint  Patient presents with  . Sore Throat    X 2 days  . Cough   UPPER RESPIRATORY TRACT INFECTION Duration: 2 days Worst symptom: Sore throat Fever: no Cough: yes Shortness of breath: yes Wheezing: no Chest pain: no Chest tightness: no Chest congestion: no Nasal congestion: no Runny nose: no Post nasal drip: yes Sneezing: yes Sore throat: yes Swollen glands: yes Sinus pressure: no Headache: no Face pain: no Toothache: no Ear pain: no  Ear pressure: no  Eyes red/itching:no Eye drainage/crusting: no  Vomiting: no Rash: no Fatigue: yes Sick contacts: no Strep contacts: no  Context: stable Recurrent sinusitis: no Relief with OTC cold/cough medications: no  Treatments attempted: cold/sinus, mucinex, anti-histamine and pseudoephedrine   Relevant past medical, surgical, family and social history reviewed and updated as indicated. Interim medical history since our last visit reviewed. Allergies and medications reviewed and updated.  Review of Systems  Constitutional: Negative.   HENT: Positive for postnasal drip and sore throat. Negative for congestion, dental problem, drooling, ear discharge, ear pain, facial swelling, hearing loss, mouth sores, nosebleeds, rhinorrhea, sinus pressure, sinus pain, sneezing, tinnitus, trouble swallowing and voice change.   Respiratory: Positive for shortness of breath. Negative for apnea, cough, choking, chest tightness, wheezing and stridor.   Cardiovascular: Negative.   Gastrointestinal: Negative.   Psychiatric/Behavioral: Negative.     Per HPI unless specifically indicated above     Objective:    Temp 97.8 F (36.6 C)   Wt Readings from Last 3 Encounters:  05/27/19 183 lb 8 oz (83.2 kg)  07/20/18 192 lb 6 oz (87.3 kg)  01/01/18 188 lb (85.3 kg)    Physical Exam Vitals signs and nursing note reviewed.  Constitutional:      General: He is not in acute distress.    Appearance: Normal appearance. He is not ill-appearing, toxic-appearing or diaphoretic.  HENT:     Head: Normocephalic and atraumatic.     Right Ear: External ear normal.     Left Ear: External ear normal.     Nose: Nose normal.     Mouth/Throat:     Mouth: Mucous membranes are moist.     Pharynx: Oropharynx is clear.  Eyes:     General: No scleral icterus.       Right eye: No discharge.        Left eye: No discharge.     Conjunctiva/sclera: Conjunctivae normal.     Pupils: Pupils are equal, round, and reactive to light.  Neck:     Musculoskeletal: Normal range of motion.  Pulmonary:     Effort: Pulmonary effort is normal. No respiratory distress.     Comments: Speaking in full sentences Musculoskeletal: Normal range of motion.  Skin:    Coloration: Skin is not jaundiced or pale.     Findings: No bruising, erythema, lesion or rash.  Neurological:     Mental Status: He is alert and oriented to person, place, and time. Mental status is at baseline.  Psychiatric:        Mood and Affect: Mood normal.        Behavior: Behavior normal.        Thought Content: Thought content normal.        Judgment: Judgment  normal.     Results for orders placed or performed in visit on 05/27/19  HM COLONOSCOPY  Result Value Ref Range   HM Colonoscopy See Report (in chart) See Report (in chart), Patient Reported      Assessment & Plan:   Problem List Items Addressed This Visit    None    Visit Diagnoses    Sore throat    -  Primary   Likely PND- will swab for COVID- self-quarantine until results are back. Will treat with burst of prednisone for comfort. Call with any concerns.    Relevant Orders   Novel Coronavirus, NAA ([facility])       Follow up plan: Return if symptoms worsen or fail to improve.   . This visit was completed via Doximity due to the restrictions  of the COVID-19 pandemic. All issues as above were discussed and addressed. Physical exam was done as above through visual confirmation on Doximity. If it was felt that the patient should be evaluated in the office, they were directed there. The patient verbally consented to this visit. . Location of the patient: home . Location of the provider: home . Those involved with this call:  . Provider: Park Liter, DO . CMA: Tiffany Reel, CMA . Front Desk/Registration: Don Perking  . Time spent on call: 15 minutes with patient face to face via video conference. More than 50% of this time was spent in counseling and coordination of care. 23 minutes total spent in review of patient's record and preparation of their chart.

## 2019-06-07 ENCOUNTER — Other Ambulatory Visit: Payer: Self-pay | Admitting: Family Medicine

## 2019-06-07 MED ORDER — AMOXICILLIN-POT CLAVULANATE 875-125 MG PO TABS: 1.0000 | ORAL_TABLET | Freq: Two times a day (BID) | ORAL | 0 refills | Status: DC

## 2019-06-07 MED ORDER — BENZONATATE 200 MG PO CAPS: 200.0000 mg | ORAL_CAPSULE | Freq: Two times a day (BID) | ORAL | 0 refills | Status: DC | PRN

## 2019-06-07 NOTE — Telephone Encounter (Signed)
Tried calling patient. No answer and VM not set up. Will try to call again later.  

## 2019-06-07 NOTE — Telephone Encounter (Signed)
Abx sent to his pharmacy. If he is not getting better, let us know.

## 2019-06-07 NOTE — Telephone Encounter (Signed)
Pt had virtual visit last Monday.  Pt's Covid test neg. (he got a [facility]) However, pt states he is still having phlegm in his throat and he states it will not move.  It wants to go down, and does not want to break up.  Pt states he is also getting a cough. Pt states he got this last year and needed an abx and cough med. Pt declined another virtual visit. Pt hopes Dr Wynetta Emery will call in abx, cough med, and whatever else she thinks without another visit. .   Pt states the prednisone is not doing a  thing.     [id number] [address], [place] Phone:  [phone]  Fax:  [phone]

## 2019-06-08 NOTE — Telephone Encounter (Signed)
Patient notified and verbalized understanding. 

## 2019-07-15 ENCOUNTER — Telehealth: Payer: Self-pay | Admitting: Family Medicine

## 2019-07-15 NOTE — Telephone Encounter (Signed)
Does this need to be virtual? °

## 2019-07-15 NOTE — Telephone Encounter (Signed)
Patient states he has to clear his throat often. Patient states he feels like he has a "frog" stuck in his throat. Does state he has a problem with acid reflux. Also states he will have pinkish sputum sometimes. Slight cough around when he's clearing his throat. Doesn't know if he needs another antibiotic, new reflux medication or to see ENT. Wanted Dr. Laural Benes to advise him on what to do. Denies nasal congestion, fever, sore throat and pain.  Routing to provider. Does patient need to be seen?

## 2019-07-15 NOTE — Telephone Encounter (Signed)
Patient called and would like to speak with Dr. Laural Benes or her CMA about issue he is having with clearing his throat often more then usual. He wants to know if there is a antibiotic he can take or does he need to go to ENT for them to take a look at.

## 2019-07-15 NOTE — Telephone Encounter (Signed)
It can be but doesn't have to be

## 2019-07-15 NOTE — Telephone Encounter (Signed)
Appt pls

## 2019-07-15 NOTE — Telephone Encounter (Signed)
Called and spoke with patient. Scheduled for 07/17/19 at 2 pm

## 2019-07-16 ENCOUNTER — Other Ambulatory Visit: Payer: Self-pay

## 2019-07-16 ENCOUNTER — Ambulatory Visit (INDEPENDENT_AMBULATORY_CARE_PROVIDER_SITE_OTHER): Payer: BC Managed Care – PPO | Admitting: Family Medicine

## 2019-07-16 ENCOUNTER — Encounter: Payer: Self-pay | Admitting: Family Medicine

## 2019-07-16 VITALS — Temp 98.5°F | Wt 185.0 lb

## 2019-07-16 DIAGNOSIS — Z20822 Contact with and (suspected) exposure to covid-19: Secondary | ICD-10-CM | POA: Diagnosis not present

## 2019-07-16 DIAGNOSIS — K2100 Gastro-esophageal reflux disease with esophagitis, without bleeding: Secondary | ICD-10-CM | POA: Diagnosis not present

## 2019-07-16 DIAGNOSIS — R05 Cough: Secondary | ICD-10-CM | POA: Diagnosis not present

## 2019-07-16 DIAGNOSIS — R059 Cough, unspecified: Secondary | ICD-10-CM

## 2019-07-16 MED ORDER — HYDROCOD POLST-CPM POLST ER 10-8 MG/5ML PO SUER: 5.0000 mL | Freq: Two times a day (BID) | ORAL | 0 refills | Status: DC | PRN

## 2019-07-16 MED ORDER — MONTELUKAST SODIUM 10 MG PO TABS: 10.0000 mg | ORAL_TABLET | Freq: Every day | ORAL | 3 refills | Status: DC

## 2019-07-16 MED ORDER — FLUTICASONE PROPIONATE 50 MCG/ACT NA SUSP: 1.0000 | Freq: Two times a day (BID) | NASAL | 1 refills | Status: DC

## 2019-07-16 MED ORDER — PREDNISONE 50 MG PO TABS: 50.0000 mg | ORAL_TABLET | Freq: Every day | ORAL | 0 refills | Status: DC

## 2019-07-16 NOTE — Assessment & Plan Note (Signed)
May try increasing to 40 mg prilosec to see if this helps as he feels his throat issue may be related, but more so suspect allergies/post nasal drainage to be culprit.

## 2019-07-16 NOTE — Progress Notes (Signed)
Temp 98.5 F (36.9 C) (Oral)   Wt 185 lb (83.9 kg)   BMI 27.88 kg/m    Subjective:    Patient ID: Andres Dillon, male    DOB: 05-26-1961, 59 y.o.   MRN: [id number]  HPI: Andres Dillon is a 59 y.o. male  Chief Complaint  Patient presents with  . Cough    pt states it is hard to clear his throat off  . Fever    on and off x over a week    . This visit was completed via WebEx due to the restrictions of the COVID-19 pandemic. All issues as above were discussed and addressed. Physical exam was done as above through visual confirmation on WebEx. If it was felt that the patient should be evaluated in the office, they were directed there. The patient verbally consented to this visit. . Location of the patient: home . Location of the provider: work . Those involved with this call:  . Provider: Merrie Roof, PA-C . CMA: Lesle Chris, [place] . Front Desk/Registration: Jill Side  . Time spent on call: 25 minutes with patient face to face via video conference. More than 50% of this time was spent in counseling and coordination of care. 5 minutes total spent in review of patient's record and preparation of their chart. I verified patient identity using two factors (patient name and date of birth). Patient consents verbally to being seen via telemedicine visit today.   Patient presenting today for several concerns.   Main one is constantly having to clear throat every few minutes, mild hacky cough ongoing for over a month. This has happened in the past here and there as well. Worst when trying to lay down to go to bed at night, and notes associated with nose "stopping up" when trying to go to bed. Does have known reflux but taking prilosec daily which hasn't seemed to help much. Had appt set for today with ENT but had to cancel due to [place]. Denies dysphagia, difficulty breathing. Has been trying cough medications with minimal relief.   Was tested for COVID 4 days ago and waiting on  results but girlfriend and friend positive. Fevers off and on, some sore throat but otherwise feeling well overall.   Relevant past medical, surgical, family and social history reviewed and updated as indicated. Interim medical history since our last visit reviewed. Allergies and medications reviewed and updated.  Review of Systems  Per HPI unless specifically indicated above     Objective:    Temp 98.5 F (36.9 C) (Oral)   Wt 185 lb (83.9 kg)   BMI 27.88 kg/m   Wt Readings from Last 3 Encounters:  07/16/19 185 lb (83.9 kg)  05/27/19 183 lb 8 oz (83.2 kg)  07/20/18 192 lb 6 oz (87.3 kg)    Physical Exam Vitals and nursing note reviewed.  Constitutional:      General: He is not in acute distress.    Appearance: Normal appearance.  HENT:     Head: Atraumatic.     Right Ear: External ear normal.     Left Ear: External ear normal.     Nose: Nose normal. No congestion.     Mouth/Throat:     Mouth: Mucous membranes are moist.     Pharynx: Oropharynx is clear. Posterior oropharyngeal erythema present.  Eyes:     Extraocular Movements: Extraocular movements intact.     Conjunctiva/sclera: Conjunctivae normal.  Pulmonary:     Effort:  Pulmonary effort is normal. No respiratory distress.  Musculoskeletal:        General: Normal range of motion.     Cervical back: Normal range of motion.  Skin:    General: Skin is dry.     Findings: No erythema or rash.  Neurological:     Mental Status: He is oriented to person, place, and time.  Psychiatric:        Mood and Affect: Mood normal.        Thought Content: Thought content normal.        Judgment: Judgment normal.     Results for orders placed or performed in visit on 05/27/19  HM COLONOSCOPY  Result Value Ref Range   HM Colonoscopy See Report (in chart) See Report (in chart), Patient Reported      Assessment & Plan:   Problem List Items Addressed This Visit      Digestive   Gastroesophageal reflux disease with  esophagitis    May try increasing to 40 mg prilosec to see if this helps as he feels his throat issue may be related, but more so suspect allergies/post nasal drainage to be culprit.        Other Visit Diagnoses    Cough    -  Primary   Suspect his constant throat clearing and mild cough related to post nasal drainage. Singulair, flonase and short pred burst sent. Tussionex at bedtime   Suspected COVID-19 virus infection       Awaiting test results, continue isolation per CDC guidelines. Supportive care and return precautions given       Follow up plan: Return if symptoms worsen or fail to improve.

## 2019-08-07 ENCOUNTER — Other Ambulatory Visit: Payer: Self-pay | Admitting: Family Medicine

## 2019-08-07 NOTE — Telephone Encounter (Signed)
Requested Prescriptions  Pending Prescriptions Disp Refills  . fluticasone (FLONASE) 50 MCG/ACT nasal spray [Pharmacy Med Name: FLUTICASONE PROP 50 MCG SPRAY] 16 mL 1    Sig: PLACE 1 SPRAY INTO BOTH NOSTRILS 2 (TWO) TIMES DAILY.     Ear, Nose, and Throat: Nasal Preparations - Corticosteroids Passed - 08/07/2019 10:32 AM      Passed - Valid encounter within last 12 months    Recent Outpatient Visits          3 weeks ago Cough   [facility] [place] Maser [place], [place]   2 months ago Sore throat   [facility] [place], [place], [place]   2 months ago Routine general medical examination at a health care facility   [facility] [place], [place], [place]   1 year ago Influenza   [facility] Part[place], [place]   1 year ago PE (physical exam), annual   [facility] [place], [place]ge Gainer, MD      Future Appointments            In 9 months Laural Benes, Oralia Rud, DO [facility]

## 2019-08-12 ENCOUNTER — Other Ambulatory Visit: Payer: Self-pay | Admitting: Family Medicine

## 2019-08-18 DIAGNOSIS — H43811 Vitreous degeneration, right eye: Secondary | ICD-10-CM | POA: Diagnosis not present

## 2019-10-07 ENCOUNTER — Other Ambulatory Visit: Payer: Self-pay | Admitting: Family Medicine

## 2019-10-21 ENCOUNTER — Other Ambulatory Visit: Payer: Self-pay | Admitting: Family Medicine

## 2019-10-21 NOTE — Telephone Encounter (Signed)
Pharmacy is requesting 90 days supply of medication- original Rx for decreased amount- sent for review

## 2019-10-21 NOTE — Telephone Encounter (Signed)
Routing to provider  

## 2019-11-26 DIAGNOSIS — R1314 Dysphagia, pharyngoesophageal phase: Secondary | ICD-10-CM | POA: Diagnosis not present

## 2019-11-26 DIAGNOSIS — K219 Gastro-esophageal reflux disease without esophagitis: Secondary | ICD-10-CM | POA: Diagnosis not present

## 2020-02-09 DIAGNOSIS — Z03818 Encounter for observation for suspected exposure to other biological agents ruled out: Secondary | ICD-10-CM | POA: Diagnosis not present

## 2020-03-15 DIAGNOSIS — Z03818 Encounter for observation for suspected exposure to other biological agents ruled out: Secondary | ICD-10-CM | POA: Diagnosis not present

## 2020-03-22 DIAGNOSIS — Z03818 Encounter for observation for suspected exposure to other biological agents ruled out: Secondary | ICD-10-CM | POA: Diagnosis not present

## 2020-03-29 DIAGNOSIS — Z03818 Encounter for observation for suspected exposure to other biological agents ruled out: Secondary | ICD-10-CM | POA: Diagnosis not present

## 2020-04-03 DIAGNOSIS — Z03818 Encounter for observation for suspected exposure to other biological agents ruled out: Secondary | ICD-10-CM | POA: Diagnosis not present

## 2020-04-10 DIAGNOSIS — Z03818 Encounter for observation for suspected exposure to other biological agents ruled out: Secondary | ICD-10-CM | POA: Diagnosis not present

## 2020-04-14 ENCOUNTER — Other Ambulatory Visit: Payer: Self-pay | Admitting: Family Medicine

## 2020-04-14 NOTE — Telephone Encounter (Signed)
Requested medications are due for refill today yes  Requested medications are on the active medication list yes  Last refill 7/25  Last visit No valid visit within time frame  Future visit scheduled Dec 2021  Notes to clinic failed protocol of valid visit within 12 months, does have upcoming appt.

## 2020-04-19 DIAGNOSIS — Z03818 Encounter for observation for suspected exposure to other biological agents ruled out: Secondary | ICD-10-CM | POA: Diagnosis not present

## 2020-04-25 DIAGNOSIS — Z03818 Encounter for observation for suspected exposure to other biological agents ruled out: Secondary | ICD-10-CM | POA: Diagnosis not present

## 2020-05-01 DIAGNOSIS — Z03818 Encounter for observation for suspected exposure to other biological agents ruled out: Secondary | ICD-10-CM | POA: Diagnosis not present

## 2020-05-03 DIAGNOSIS — Z03818 Encounter for observation for suspected exposure to other biological agents ruled out: Secondary | ICD-10-CM | POA: Diagnosis not present

## 2020-06-01 ENCOUNTER — Encounter: Payer: BC Managed Care – PPO | Admitting: Family Medicine

## 2020-06-03 ENCOUNTER — Other Ambulatory Visit: Payer: Self-pay | Admitting: Family Medicine

## 2020-06-03 NOTE — Telephone Encounter (Signed)
Requested medications are due for refill today yes  Requested medications are on the active medication list yes  Last refill 9/9  Last visit 1/20212  Future visit scheduled 06/2020  Notes to clinic OV note states try increasing to 40mg , pt did not return for recheck, does have upcoming visit in Jan 2022.

## 2020-06-06 DIAGNOSIS — H43391 Other vitreous opacities, right eye: Secondary | ICD-10-CM | POA: Diagnosis not present

## 2020-06-30 ENCOUNTER — Ambulatory Visit (INDEPENDENT_AMBULATORY_CARE_PROVIDER_SITE_OTHER): Payer: BC Managed Care – PPO | Admitting: Family Medicine

## 2020-06-30 ENCOUNTER — Encounter: Payer: Self-pay | Admitting: Family Medicine

## 2020-06-30 ENCOUNTER — Other Ambulatory Visit: Payer: Self-pay

## 2020-06-30 VITALS — BP 130/82 | HR 65 | Temp 97.9°F | Ht 68.5 in | Wt 191.4 lb

## 2020-06-30 DIAGNOSIS — Z981 Arthrodesis status: Secondary | ICD-10-CM | POA: Diagnosis not present

## 2020-06-30 DIAGNOSIS — Z0000 Encounter for general adult medical examination without abnormal findings: Secondary | ICD-10-CM | POA: Diagnosis not present

## 2020-06-30 DIAGNOSIS — R030 Elevated blood-pressure reading, without diagnosis of hypertension: Secondary | ICD-10-CM | POA: Diagnosis not present

## 2020-06-30 DIAGNOSIS — Z136 Encounter for screening for cardiovascular disorders: Secondary | ICD-10-CM | POA: Diagnosis not present

## 2020-06-30 DIAGNOSIS — M5135 Other intervertebral disc degeneration, thoracolumbar region: Secondary | ICD-10-CM | POA: Diagnosis not present

## 2020-06-30 DIAGNOSIS — K2100 Gastro-esophageal reflux disease with esophagitis, without bleeding: Secondary | ICD-10-CM | POA: Diagnosis not present

## 2020-06-30 LAB — MICROSCOPIC EXAMINATION
Bacteria, UA: NONE SEEN
WBC, UA: NONE SEEN /hpf (ref 0–5)

## 2020-06-30 LAB — URINALYSIS, ROUTINE W REFLEX MICROSCOPIC
Bilirubin, UA: NEGATIVE
Glucose, UA: NEGATIVE
Ketones, UA: NEGATIVE
Leukocytes,UA: NEGATIVE
Nitrite, UA: NEGATIVE
Protein,UA: NEGATIVE
Specific Gravity, UA: 1.015 (ref 1.005–1.030)
Urobilinogen, Ur: 0.2 mg/dL (ref 0.2–1.0)
pH, UA: 5.5 (ref 5.0–7.5)

## 2020-06-30 LAB — MICROALBUMIN, URINE WAIVED
Creatinine, Urine Waived: 50 mg/dL (ref 10–300)
Microalb, Ur Waived: 10 mg/L (ref 0–19)
Microalb/Creat Ratio: <30 mg/g (ref <30)

## 2020-06-30 MED ORDER — GABAPENTIN 100 MG PO CAPS: ORAL_CAPSULE | ORAL | 1 refills | Status: DC

## 2020-06-30 MED ORDER — MONTELUKAST SODIUM 10 MG PO TABS: ORAL_TABLET | ORAL | 3 refills | Status: DC

## 2020-06-30 MED ORDER — FLUTICASONE PROPIONATE 50 MCG/ACT NA SUSP: 1.0000 | Freq: Two times a day (BID) | NASAL | 3 refills | Status: DC

## 2020-06-30 MED ORDER — SILDENAFIL CITRATE 20 MG PO TABS: 20.0000 mg | ORAL_TABLET | ORAL | 12 refills | Status: DC | PRN

## 2020-06-30 NOTE — Progress Notes (Signed)
BP 130/82   Pulse 65   Temp 97.9 F (36.6 C)   Ht 5' 8.5" (1.74 m)   Wt 191 lb 6.4 oz (86.8 kg)   SpO2 98%   BMI 28.68 kg/m    Subjective:    Patient ID: Andres Dillon, male    DOB: [birth year], 60 y.o.   MRN: [id number]  HPI: Andres Dillon is a 60 y.o. male presenting on 06/30/2020 for comprehensive medical examination. Current medical complaints include:  Back has been acting up a bit more. He has noticed that it's hurting a lot more than it was previously. Had surgery done back in the 90s, and was on gabapentin in the past and it really helped. He was taking it as needed and he notes that it helped.   GERD GERD control status: controlled  Satisfied with current treatment? yes Heartburn frequency: not when he's on his meds Medication side effects: no  Medication compliance: excellent Dysphagia: no Odynophagia:  no Hematemesis: no Blood in stool: no EGD: no  Interim Problems from his last visit: no  Depression Screen done today and results listed below:  Depression screen [facility] 2/9 06/30/2020 05/27/2019 01/01/2018 12/18/2016  Decreased Interest 1 0 1 0  Down, Depressed, Hopeless 1 0 1 0  PHQ - 2 Score 2 0 2 0  Altered sleeping 1 - 1 -  Tired, decreased energy 0 - 2 -  Change in appetite 0 - 0 -  Feeling bad or failure about yourself  0 - 0 -  Trouble concentrating 0 - 0 -  Moving slowly or fidgety/restless 0 - 0 -  Suicidal thoughts 0 - 0 -  PHQ-9 Score 3 - 5 -  Difficult doing work/chores Somewhat difficult - - -    Past Medical History:  Past Medical History:  Diagnosis Date  . Arthritis   . GERD (gastroesophageal reflux disease)     Surgical History:  Past Surgical History:  Procedure Laterality Date  . COLONOSCOPY  2014  . SPINE SURGERY  12/23/1995   fell/ rods and screws T10 /L2 fusions    Medications:  Current Outpatient Medications on File Prior to Visit  Medication Sig  . omeprazole (PRILOSEC) 20 MG capsule TAKE 1 CAPSULE BY MOUTH EVERY DAY  .  ibuprofen (ADVIL,MOTRIN) 800 MG tablet Take 1 tablet (800 mg total) by mouth every 8 (eight) hours as needed. (Patient not taking: Reported on 06/30/2020)   No current facility-administered medications on file prior to visit.    Allergies:  No Known Allergies  Social History:  Social History   Socioeconomic History  . Marital status: Married    Spouse name: Not on file  . Number of children: Not on file  . Years of education: Not on file  . Highest education level: Not on file  Occupational History  . Not on file  Tobacco Use  . Smoking status: Never Smoker  . Smokeless tobacco: Never Used  Vaping Use  . Vaping Use: Never used  Substance and Sexual Activity  . Alcohol use: Yes    Alcohol/week: 12.0 standard drinks    Types: 12 Cans of beer per week  . Drug use: No  . Sexual activity: Not on file  Other Topics Concern  . Not on file  Social History Narrative  . Not on file   Social Determinants of Health   Financial Resource Strain: Not on file  Food Insecurity: Not on file  Transportation Needs: Not on file  Physical  Activity: Not on file  Stress: Not on file  Social Connections: Not on file  Intimate Partner Violence: Not on file   Social History   Tobacco Use  Smoking Status Never Smoker  Smokeless Tobacco Never Used   Social History   Substance and Sexual Activity  Alcohol Use Yes  . Alcohol/week: 12.0 standard drinks  . Types: 12 Cans of beer per week    Family History:  Family History  Problem Relation Age of Onset  . Cancer Mother   . Cancer Father   . Heart disease Father 80       CABG   . Kidney disease Father     Past medical history, surgical history, medications, allergies, family history and social history reviewed with patient today and changes made to appropriate areas of the chart.   Review of Systems  Constitutional: Negative.   HENT: Negative.   Eyes: Negative.        Floaters  Respiratory: Negative.   Cardiovascular:  Negative.   Gastrointestinal: Negative.   Genitourinary: Negative.   Musculoskeletal: Positive for back pain and myalgias. Negative for falls, joint pain and neck pain.  Skin: Negative.   Neurological: Negative.   Endo/Heme/Allergies: Negative.   Psychiatric/Behavioral: Negative.     All other ROS negative except what is listed above and in the HPI.      Objective:    BP 130/82   Pulse 65   Temp 97.9 F (36.6 C)   Ht 5' 8.5" (1.74 m)   Wt 191 lb 6.4 oz (86.8 kg)   SpO2 98%   BMI 28.68 kg/m   Wt Readings from Last 3 Encounters:  06/30/20 191 lb 6.4 oz (86.8 kg)  07/16/19 185 lb (83.9 kg)  05/27/19 183 lb 8 oz (83.2 kg)    Physical Exam Vitals and nursing note reviewed.  Constitutional:      General: He is not in acute distress.    Appearance: Normal appearance. He is obese. He is not ill-appearing, toxic-appearing or diaphoretic.  HENT:     Head: Normocephalic and atraumatic.     Right Ear: Tympanic membrane, ear canal and external ear normal. There is no impacted cerumen.     Left Ear: Tympanic membrane, ear canal and external ear normal. There is no impacted cerumen.     Nose: Nose normal. No congestion or rhinorrhea.     Mouth/Throat:     Mouth: Mucous membranes are moist.     Pharynx: Oropharynx is clear. No oropharyngeal exudate or posterior oropharyngeal erythema.  Eyes:     General: No scleral icterus.       Right eye: No discharge.        Left eye: No discharge.     Extraocular Movements: Extraocular movements intact.     Conjunctiva/sclera: Conjunctivae normal.     Pupils: Pupils are equal, round, and reactive to light.  Neck:     Vascular: No carotid bruit.  Cardiovascular:     Rate and Rhythm: Normal rate and regular rhythm.     Pulses: Normal pulses.     Heart sounds: No murmur heard. No friction rub. No gallop.   Pulmonary:     Effort: Pulmonary effort is normal. No respiratory distress.     Breath sounds: Normal breath sounds. No stridor. No  wheezing, rhonchi or rales.  Chest:     Chest wall: No tenderness.  Abdominal:     General: Abdomen is flat. Bowel sounds are normal. There is no distension.  Palpations: Abdomen is soft. There is no mass.     Tenderness: There is no abdominal tenderness. There is no right CVA tenderness, left CVA tenderness, guarding or rebound.     Hernia: No hernia is present.  Genitourinary:    Comments: Genital exam deferred with shared decision making Musculoskeletal:        General: No swelling, tenderness, deformity or signs of injury.     Cervical back: Normal range of motion and neck supple. No rigidity. No muscular tenderness.     Right lower leg: No edema.     Left lower leg: No edema.  Lymphadenopathy:     Cervical: No cervical adenopathy.  Skin:    General: Skin is warm and dry.     Capillary Refill: Capillary refill takes less than 2 seconds.     Coloration: Skin is not jaundiced or pale.     Findings: No bruising, erythema, lesion or rash.  Neurological:     General: No focal deficit present.     Mental Status: He is alert and oriented to person, place, and time.     Cranial Nerves: No cranial nerve deficit.     Sensory: No sensory deficit.     Motor: No weakness.     Coordination: Coordination normal.     Gait: Gait normal.     Deep Tendon Reflexes: Reflexes normal.  Psychiatric:        Mood and Affect: Mood normal.        Behavior: Behavior normal.        Thought Content: Thought content normal.        Judgment: Judgment normal.     Results for orders placed or performed in visit on 06/30/20  Microscopic Examination   Urine  Result Value Ref Range   WBC, UA None seen 0 - 5 /hpf   RBC 0-2 0 - 2 /hpf   Epithelial Cells (non renal) 0-10 0 - 10 /hpf   Bacteria, UA None seen None seen/Few  Microalbumin, Urine Waived  Result Value Ref Range   Microalb, Ur Waived 10 0 - 19 mg/L   Creatinine, Urine Waived 50 10 - 300 mg/dL   Microalb/Creat Ratio <30 <30 mg/g   Comprehensive metabolic panel  Result Value Ref Range   Glucose 95 65 - 99 mg/dL   BUN 17 6 - 24 mg/dL   Creatinine, Ser 9.50 0.76 - 1.27 mg/dL   GFR calc non Af Amer 71 >59 mL/min/1.73   GFR calc Af Amer 82 >59 mL/min/1.73   BUN/Creatinine Ratio 15 9 - 20   Sodium 138 134 - 144 mmol/L   Potassium 4.2 3.5 - 5.2 mmol/L   Chloride 103 96 - 106 mmol/L   CO2 22 20 - 29 mmol/L   Calcium 9.5 8.7 - 10.2 mg/dL   Total Protein 6.9 6.0 - 8.5 g/dL   Albumin 4.7 3.8 - 4.9 g/dL   Globulin, Total 2.2 1.5 - 4.5 g/dL   Albumin/Globulin Ratio 2.1 1.2 - 2.2   Bilirubin Total 0.9 0.0 - 1.2 mg/dL   Alkaline Phosphatase 64 44 - 121 IU/L   AST 22 0 - 40 IU/L   ALT 23 0 - 44 IU/L  CBC with Differential/Platelet  Result Value Ref Range   WBC 5.9 3.4 - 10.8 x10E3/uL   RBC 4.98 4.14 - 5.80 x10E6/uL   Hemoglobin 15.1 13.0 - 17.7 g/dL   Hematocrit 93.2 67.1 - 51.0 %   MCV 89 79 - 97 fL  MCH 30.3 26.6 - 33.0 pg   MCHC 34.2 31.5 - 35.7 g/dL   RDW 81.1 91.4 - 78.2 %   Platelets 242 150 - 450 x10E3/uL   Neutrophils 67 Not Estab. %   Lymphs 20 Not Estab. %   Monocytes 9 Not Estab. %   Eos 2 Not Estab. %   Basos 1 Not Estab. %   Neutrophils Absolute 4.1 1.4 - 7.0 x10E3/uL   Lymphocytes Absolute 1.2 0.7 - 3.1 x10E3/uL   Monocytes Absolute 0.5 0.1 - 0.9 x10E3/uL   EOS (ABSOLUTE) 0.1 0.0 - 0.4 x10E3/uL   Basophils Absolute 0.0 0.0 - 0.2 x10E3/uL   Immature Granulocytes 1 Not Estab. %   Immature Grans (Abs) 0.0 0.0 - 0.1 x10E3/uL  Lipid Panel w/o Chol/HDL Ratio  Result Value Ref Range   Cholesterol, Total 217 (H) 100 - 199 mg/dL   Triglycerides 956 0 - 149 mg/dL   HDL 44 >21 mg/dL   VLDL Cholesterol Cal 23 5 - 40 mg/dL   LDL Chol Calc (NIH) 308 (H) 0 - 99 mg/dL  PSA  Result Value Ref Range   Prostate Specific Ag, Serum 3.1 0.0 - 4.0 ng/mL  TSH  Result Value Ref Range   TSH 1.200 0.450 - 4.500 uIU/mL  Urinalysis, Routine w reflex microscopic  Result Value Ref Range   Specific Gravity, UA 1.015  1.005 - 1.030   pH, UA 5.5 5.0 - 7.5   Color, UA Yellow Yellow   Appearance Ur Clear Clear   Leukocytes,UA Negative Negative   Protein,UA Negative Negative/Trace   Glucose, UA Negative Negative   Ketones, UA Negative Negative   RBC, UA Trace (A) Negative   Bilirubin, UA Negative Negative   Urobilinogen, Ur 0.2 0.2 - 1.0 mg/dL   Nitrite, UA Negative Negative   Microscopic Examination See below:       Assessment & Plan:   Problem List Items Addressed This Visit      Digestive   Gastroesophageal reflux disease with esophagitis    Under good control on current regimen. Continue current regimen. Continue to monitor. Call with any concerns. Refills given. Labs drawn today.        Relevant Orders   CBC with Differential/Platelet (Completed)     Musculoskeletal and Integument   DDD (degenerative disc disease), thoracolumbar     Other   S/P lumbar fusion    Acting up again. Continue gabapentin. Will check lumbar x-ray. Await results. May need MRI.       Relevant Orders   DG Lumbar Spine Complete    Other Visit Diagnoses    Routine general medical examination at a health care facility    -  Primary   Vaccines up to date. Screening labs checked today. Colonoscopy up to date. Continue to monitor. Call with any concerns.   Relevant Orders   Microalbumin, Urine Waived (Completed)   Comprehensive metabolic panel (Completed)   CBC with Differential/Platelet (Completed)   Lipid Panel w/o Chol/HDL Ratio (Completed)   PSA (Completed)   TSH (Completed)   Urinalysis, Routine w reflex microscopic (Completed)   Elevated blood pressure reading       Better on recheck. Continue to monitor. Call with any concerns.    Relevant Orders   Microalbumin, Urine Waived (Completed)       LABORATORY TESTING:  Health maintenance labs ordered today as discussed above.   The natural history of prostate cancer and ongoing controversy regarding screening and potential treatment outcomes of  prostate  cancer has been discussed with the patient. The meaning of a false positive PSA and a false negative PSA has been discussed. He indicates understanding of the limitations of this screening test and wishes to proceed with screening PSA testing.   IMMUNIZATIONS:   - Tdap: Tetanus vaccination status reviewed: last tetanus booster within 10 years. - Influenza: Refused - Pneumovax: Not applicable - COVID: Refused  SCREENING: - Colonoscopy: Up to date  Discussed with patient purpose of the colonoscopy is to detect colon cancer at curable precancerous or early stages   PATIENT COUNSELING:    Sexuality: Discussed sexually transmitted diseases, partner selection, use of condoms, avoidance of unintended pregnancy  and contraceptive alternatives.   Advised to avoid cigarette smoking.  I discussed with the patient that most people either abstain from alcohol or drink within safe limits (<=14/week and <=4 drinks/occasion for males, <=7/weeks and <= 3 drinks/occasion for females) and that the risk for alcohol disorders and other health effects rises proportionally with the number of drinks per week and how often a drinker exceeds daily limits.  Discussed cessation/primary prevention of drug use and availability of treatment for abuse.   Diet: Encouraged to adjust caloric intake to maintain  or achieve ideal body weight, to reduce intake of dietary saturated fat and total fat, to limit sodium intake by avoiding high sodium foods and not adding table salt, and to maintain adequate dietary potassium and calcium preferably from fresh fruits, vegetables, and low-fat dairy products.    stressed the importance of regular exercise  Injury prevention: Discussed safety belts, safety helmets, smoke detector, smoking near bedding or upholstery.   Dental health: Discussed importance of regular tooth brushing, flossing, and dental visits.   Follow up plan: NEXT PREVENTATIVE PHYSICAL DUE IN 1  YEAR. Return in about 1 year (around 06/30/2021).

## 2020-06-30 NOTE — Patient Instructions (Signed)
Health Maintenance, Male Adopting a healthy lifestyle and getting preventive care are important in promoting health and wellness. Ask your health care provider about:  The right schedule for you to have regular tests and exams.  Things you can do on your own to prevent diseases and keep yourself healthy. What should I know about diet, weight, and exercise? Eat a healthy diet   Eat a diet that includes plenty of vegetables, fruits, low-fat dairy products, and lean protein.  Do not eat a lot of foods that are high in solid fats, added sugars, or sodium. Maintain a healthy weight Body mass index (BMI) is a measurement that can be used to identify possible weight problems. It estimates body fat based on height and weight. Your health care provider can help determine your BMI and help you achieve or maintain a healthy weight. Get regular exercise Get regular exercise. This is one of the most important things you can do for your health. Most adults should:  Exercise for at least 150 minutes each week. The exercise should increase your heart rate and make you sweat (moderate-intensity exercise).  Do strengthening exercises at least twice a week. This is in addition to the moderate-intensity exercise.  Spend less time sitting. Even light physical activity can be beneficial. Watch cholesterol and blood lipids Have your blood tested for lipids and cholesterol at 60 years of age, then have this test every 5 years. You may need to have your cholesterol levels checked more often if:  Your lipid or cholesterol levels are high.  You are older than 60 years of age.  You are at high risk for heart disease. What should I know about cancer screening? Many types of cancers can be detected early and may often be prevented. Depending on your health history and family history, you may need to have cancer screening at various ages. This may include screening for:  Colorectal cancer.  Prostate  cancer.  Skin cancer.  Lung cancer. What should I know about heart disease, diabetes, and high blood pressure? Blood pressure and heart disease  High blood pressure causes heart disease and increases the risk of stroke. This is more likely to develop in people who have high blood pressure readings, are of African descent, or are overweight.  Talk with your health care provider about your target blood pressure readings.  Have your blood pressure checked: ? Every 3-5 years if you are 18-39 years of age. ? Every year if you are 40 years old or older.  If you are between the ages of 65 and 75 and are a current or former smoker, ask your health care provider if you should have a one-time screening for abdominal aortic aneurysm (AAA). Diabetes Have regular diabetes screenings. This checks your fasting blood sugar level. Have the screening done:  Once every three years after age 45 if you are at a normal weight and have a low risk for diabetes.  More often and at a younger age if you are overweight or have a high risk for diabetes. What should I know about preventing infection? Hepatitis B If you have a higher risk for hepatitis B, you should be screened for this virus. Talk with your health care provider to find out if you are at risk for hepatitis B infection. Hepatitis C Blood testing is recommended for:  Everyone born from 1945 through 1965.  Anyone with known risk factors for hepatitis C. Sexually transmitted infections (STIs)  You should be screened each year   for STIs, including gonorrhea and chlamydia, if: ? You are sexually active and are younger than 60 years of age. ? You are older than 60 years of age and your health care provider tells you that you are at risk for this type of infection. ? Your sexual activity has changed since you were last screened, and you are at increased risk for chlamydia or gonorrhea. Ask your health care provider if you are at risk.  Ask your  health care provider about whether you are at high risk for HIV. Your health care provider may recommend a prescription medicine to help prevent HIV infection. If you choose to take medicine to prevent HIV, you should first get tested for HIV. You should then be tested every 3 months for as long as you are taking the medicine. Follow these instructions at home: Lifestyle  Do not use any products that contain nicotine or tobacco, such as cigarettes, e-cigarettes, and chewing tobacco. If you need help quitting, ask your health care provider.  Do not use street drugs.  Do not share needles.  Ask your health care provider for help if you need support or information about quitting drugs. Alcohol use  Do not drink alcohol if your health care provider tells you not to drink.  If you drink alcohol: ? Limit how much you have to 0-2 drinks a day. ? Be aware of how much alcohol is in your drink. In the [place] one drink equals one 12 oz bottle of beer (355 mL), one 5 oz glass of wine (148 mL), or one 1 oz glass of hard liquor (44 mL). General instructions  Schedule regular health, dental, and eye exams.  Stay current with your vaccines.  Tell your health care provider if: ? You often feel depressed. ? You have ever been abused or do not feel safe at home. Summary  Adopting a healthy lifestyle and getting preventive care are important in promoting health and wellness.  Follow your health care provider's instructions about healthy diet, exercising, and getting tested or screened for diseases.  Follow your health care provider's instructions on monitoring your cholesterol and blood pressure. This information is not intended to replace advice given to you by your health care provider. Make sure you discuss any questions you have with your health care provider. Document Revised: 06/03/2018 Document Reviewed: 06/03/2018 Elsevier Patient Education  2020 [employer]  DASH Eating Plan DASH  stands for "Dietary Approaches to Stop Hypertension." The DASH eating plan is a healthy eating plan that has been shown to reduce high blood pressure (hypertension). It may also reduce your risk for type 2 diabetes, heart disease, and stroke. The DASH eating plan may also help with weight loss. What are tips for following this plan?  General guidelines  Avoid eating more than 2,300 mg (milligrams) of salt (sodium) a day. If you have hypertension, you may need to reduce your sodium intake to 1,500 mg a day.  Limit alcohol intake to no more than 1 drink a day for nonpregnant women and 2 drinks a day for men. One drink equals 12 oz of beer, 5 oz of wine, or 1 oz of hard liquor.  Work with your health care provider to maintain a healthy body weight or to lose weight. Ask what an ideal weight is for you.  Get at least 30 minutes of exercise that causes your heart to beat faster (aerobic exercise) most days of the week. Activities may include walking, swimming, or biking.    Work with your health care provider or diet and nutrition specialist (dietitian) to adjust your eating plan to your individual calorie needs. Reading food labels   Check food labels for the amount of sodium per serving. Choose foods with less than 5 percent of the Daily Value of sodium. Generally, foods with less than 300 mg of sodium per serving fit into this eating plan.  To find whole grains, look for the word "whole" as the first word in the ingredient list. Shopping  Buy products labeled as "low-sodium" or "no salt added."  Buy fresh foods. Avoid canned foods and premade or frozen meals. Cooking  Avoid adding salt when cooking. Use salt-free seasonings or herbs instead of table salt or sea salt. Check with your health care provider or pharmacist before using salt substitutes.  Do not fry foods. Cook foods using healthy methods such as baking, boiling, grilling, and broiling instead.  Cook with heart-healthy oils,  such as olive, canola, soybean, or sunflower oil. Meal planning  Eat a balanced diet that includes: ? 5 or more servings of fruits and vegetables each day. At each meal, try to fill half of your plate with fruits and vegetables. ? Up to 6-8 servings of whole grains each day. ? Less than 6 oz of lean meat, poultry, or fish each day. A 3-oz serving of meat is about the same size as a deck of cards. One egg equals 1 oz. ? 2 servings of low-fat dairy each day. ? A serving of nuts, seeds, or beans 5 times each week. ? Heart-healthy fats. Healthy fats called Omega-3 fatty acids are found in foods such as flaxseeds and coldwater fish, like sardines, salmon, and mackerel.  Limit how much you eat of the following: ? Canned or prepackaged foods. ? Food that is high in trans fat, such as fried foods. ? Food that is high in saturated fat, such as fatty meat. ? Sweets, desserts, sugary drinks, and other foods with added sugar. ? Full-fat dairy products.  Do not salt foods before eating.  Try to eat at least 2 vegetarian meals each week.  Eat more home-cooked food and less restaurant, buffet, and fast food.  When eating at a restaurant, ask that your food be prepared with less salt or no salt, if possible. What foods are recommended? The items listed may not be a complete list. Talk with your dietitian about what dietary choices are best for you. Grains Whole-grain or whole-wheat bread. Whole-grain or whole-wheat pasta. Brown rice. Oatmeal. Quinoa. Bulgur. Whole-grain and low-sodium cereals. Pita bread. Low-fat, low-sodium crackers. Whole-wheat flour tortillas. Vegetables Fresh or frozen vegetables (raw, steamed, roasted, or grilled). Low-sodium or reduced-sodium tomato and vegetable juice. Low-sodium or reduced-sodium tomato sauce and tomato paste. Low-sodium or reduced-sodium canned vegetables. Fruits All fresh, dried, or frozen fruit. Canned fruit in natural juice (without added sugar). Meat  and other protein foods Skinless chicken or turkey. Ground chicken or turkey. Pork with fat trimmed off. Fish and seafood. Egg whites. Dried beans, peas, or lentils. Unsalted nuts, nut butters, and seeds. Unsalted canned beans. Lean cuts of beef with fat trimmed off. Low-sodium, lean deli meat. Dairy Low-fat (1%) or fat-free (skim) milk. Fat-free, low-fat, or reduced-fat cheeses. Nonfat, low-sodium ricotta or cottage cheese. Low-fat or nonfat yogurt. Low-fat, low-sodium cheese. Fats and oils Soft margarine without trans fats. Vegetable oil. Low-fat, reduced-fat, or light mayonnaise and salad dressings (reduced-sodium). Canola, safflower, olive, soybean, and sunflower oils. Avocado. Seasoning and other foods Herbs. Spices. Seasoning mixes   without salt. Unsalted popcorn and pretzels. Fat-free sweets. What foods are not recommended? The items listed may not be a complete list. Talk with your dietitian about what dietary choices are best for you. Grains Baked goods made with fat, such as croissants, muffins, or some breads. Dry pasta or rice meal packs. Vegetables Creamed or fried vegetables. Vegetables in a cheese sauce. Regular canned vegetables (not low-sodium or reduced-sodium). Regular canned tomato sauce and paste (not low-sodium or reduced-sodium). Regular tomato and vegetable juice (not low-sodium or reduced-sodium). Pickles. Olives. Fruits Canned fruit in a light or heavy syrup. Fried fruit. Fruit in cream or butter sauce. Meat and other protein foods Fatty cuts of meat. Ribs. Fried meat. Bacon. Sausage. Bologna and other processed lunch meats. Salami. Fatback. Hotdogs. Bratwurst. Salted nuts and seeds. Canned beans with added salt. Canned or smoked fish. Whole eggs or egg yolks. Chicken or turkey with skin. Dairy Whole or 2% milk, cream, and half-and-half. Whole or full-fat cream cheese. Whole-fat or sweetened yogurt. Full-fat cheese. Nondairy creamers. Whipped toppings. Processed cheese and  cheese spreads. Fats and oils Butter. Stick margarine. Lard. Shortening. Ghee. Bacon fat. Tropical oils, such as coconut, palm kernel, or palm oil. Seasoning and other foods Salted popcorn and pretzels. Onion salt, garlic salt, seasoned salt, table salt, and sea salt. Worcestershire sauce. Tartar sauce. Barbecue sauce. Teriyaki sauce. Soy sauce, including reduced-sodium. Steak sauce. Canned and packaged gravies. Fish sauce. Oyster sauce. Cocktail sauce. Horseradish that you find on the shelf. Ketchup. Mustard. Meat flavorings and tenderizers. Bouillon cubes. Hot sauce and Tabasco sauce. Premade or packaged marinades. Premade or packaged taco seasonings. Relishes. Regular salad dressings. Where to find more information:  National Heart, Lung, and Blood Institute: www.nhlbi.nih.gov  American Heart Association: www.heart.org Summary  The DASH eating plan is a healthy eating plan that has been shown to reduce high blood pressure (hypertension). It may also reduce your risk for type 2 diabetes, heart disease, and stroke.  With the DASH eating plan, you should limit salt (sodium) intake to 2,300 mg a day. If you have hypertension, you may need to reduce your sodium intake to 1,500 mg a day.  When on the DASH eating plan, aim to eat more fresh fruits and vegetables, whole grains, lean proteins, low-fat dairy, and heart-healthy fats.  Work with your health care provider or diet and nutrition specialist (dietitian) to adjust your eating plan to your individual calorie needs. This information is not intended to replace advice given to you by your health care provider. Make sure you discuss any questions you have with your health care provider. Document Revised: 05/23/2017 Document Reviewed: 06/03/2016 Elsevier Patient Education  2020 Elsevier Inc.  

## 2020-07-01 LAB — COMPREHENSIVE METABOLIC PANEL
ALT: 23 IU/L (ref 0–44)
AST: 22 IU/L (ref 0–40)
Albumin/Globulin Ratio: 2.1 (ref 1.2–2.2)
Albumin: 4.7 g/dL (ref 3.8–4.9)
Alkaline Phosphatase: 64 IU/L (ref 44–121)
BUN/Creatinine Ratio: 15 (ref 9–20)
BUN: 17 mg/dL (ref 6–24)
Bilirubin Total: 0.9 mg/dL (ref 0.0–1.2)
CO2: 22 mmol/L (ref 20–29)
Calcium: 9.5 mg/dL (ref 8.7–10.2)
Chloride: 103 mmol/L (ref 96–106)
Creatinine, Ser: 1.13 mg/dL (ref 0.76–1.27)
GFR calc Af Amer: 82 mL/min/{1.73_m2} (ref >59)
GFR calc non Af Amer: 71 mL/min/{1.73_m2} (ref >59)
Globulin, Total: 2.2 g/dL (ref 1.5–4.5)
Glucose: 95 mg/dL (ref 65–99)
Potassium: 4.2 mmol/L (ref 3.5–5.2)
Sodium: 138 mmol/L (ref 134–144)
Total Protein: 6.9 g/dL (ref 6.0–8.5)

## 2020-07-01 LAB — CBC WITH DIFFERENTIAL/PLATELET
Basophils Absolute: 0 10*3/uL (ref 0.0–0.2)
Basos: 1 %
EOS (ABSOLUTE): 0.1 10*3/uL (ref 0.0–0.4)
Eos: 2 %
Hematocrit: 44.1 % (ref 37.5–51.0)
Hemoglobin: 15.1 g/dL (ref 13.0–17.7)
Immature Grans (Abs): 0 10*3/uL (ref 0.0–0.1)
Immature Granulocytes: 1 %
Lymphocytes Absolute: 1.2 10*3/uL (ref 0.7–3.1)
Lymphs: 20 %
MCH: 30.3 pg (ref 26.6–33.0)
MCHC: 34.2 g/dL (ref 31.5–35.7)
MCV: 89 fL (ref 79–97)
Monocytes Absolute: 0.5 10*3/uL (ref 0.1–0.9)
Monocytes: 9 %
Neutrophils Absolute: 4.1 10*3/uL (ref 1.4–7.0)
Neutrophils: 67 %
Platelets: 242 10*3/uL (ref 150–450)
RBC: 4.98 x10E6/uL (ref 4.14–5.80)
RDW: 13.3 % (ref 11.6–15.4)
WBC: 5.9 10*3/uL (ref 3.4–10.8)

## 2020-07-01 LAB — PSA: Prostate Specific Ag, Serum: 3.1 ng/mL (ref 0.0–4.0)

## 2020-07-01 LAB — LIPID PANEL W/O CHOL/HDL RATIO
Cholesterol, Total: 217 mg/dL — ABNORMAL HIGH (ref 100–199)
HDL: 44 mg/dL (ref >39)
LDL Chol Calc (NIH): 150 mg/dL — ABNORMAL HIGH (ref 0–99)
Triglycerides: 128 mg/dL (ref 0–149)
VLDL Cholesterol Cal: 23 mg/dL (ref 5–40)

## 2020-07-01 LAB — TSH: TSH: 1.2 u[IU]/mL (ref 0.450–4.500)

## 2020-07-03 NOTE — Assessment & Plan Note (Signed)
Acting up again. Continue gabapentin. Will check lumbar x-ray. Await results. May need MRI.

## 2020-07-03 NOTE — Assessment & Plan Note (Signed)
Under good control on current regimen. Continue current regimen. Continue to monitor. Call with any concerns. Refills given. Labs drawn today.   

## 2020-08-04 ENCOUNTER — Encounter: Payer: Self-pay | Admitting: Nurse Practitioner

## 2020-08-04 ENCOUNTER — Other Ambulatory Visit: Payer: Self-pay

## 2020-08-04 ENCOUNTER — Ambulatory Visit: Payer: BC Managed Care – PPO | Admitting: Nurse Practitioner

## 2020-08-04 DIAGNOSIS — Z981 Arthrodesis status: Secondary | ICD-10-CM

## 2020-08-04 DIAGNOSIS — W108XXA Fall (on) (from) other stairs and steps, initial encounter: Secondary | ICD-10-CM

## 2020-08-04 MED ORDER — TIZANIDINE HCL 4 MG PO TABS: 4.0000 mg | ORAL_TABLET | Freq: Four times a day (QID) | ORAL | 0 refills | Status: DC | PRN

## 2020-08-04 NOTE — Progress Notes (Signed)
BP 128/86 (BP Location: Left Arm)   Pulse 88   Temp 98.8 F (37.1 C) (Oral)   Ht 5' 8.62" (1.743 m)   Wt 193 lb (87.5 kg)   SpO2 97%   BMI 28.82 kg/m    Subjective:    Patient ID: Andres Dillon, male    DOB: 15-Aug-1960, 60 y.o.   MRN: [id number]  HPI: Andres Dillon is a 60 y.o. male  Chief Complaint  Patient presents with  . Back Pain    Fell on some ice and hit back on brick steps, patient has a hx back fusion.    BACK PAIN Larey Seat on Monday, February 7th, 2022 -- went out front door and fell on some black ice and hit back on brick steps -- right side low back hit.  Has accidental insurance and needs forms filled out today.  His brother is a physician and told him to be assessed today.  Has history of back fusion in 1997 -- took some from right hip and fused 6 together "T10 to L something".  He would prefer not to pursue imaging until swelling improved, which brother recommended as well.   Duration: days Mechanism of injury: fall Location: Right and low back Onset: sudden Severity: 5/10 Quality: dull aching, steady pressure pain Frequency: constant Radiation: none Aggravating factors: putting pressure and bending Alleviating factors: rest, ice, heat and NSAIDs -- Gabapentin Status: stable Treatments attempted: ice, heat and ibuprofen  Relief with NSAIDs?: moderate Nighttime pain:  no Paresthesias / decreased sensation:  no Bowel / bladder incontinence:  no Fevers:  no Dysuria / urinary frequency:  no  Relevant past medical, surgical, family and social history reviewed and updated as indicated. Interim medical history since our last visit reviewed. Allergies and medications reviewed and updated.  Review of Systems  Constitutional: Negative for activity change, diaphoresis, fatigue and fever.  Respiratory: Negative for cough, chest tightness, shortness of breath and wheezing.   Cardiovascular: Negative for chest pain, palpitations and leg swelling.  Gastrointestinal:  Negative.   Musculoskeletal: Positive for back pain.  Neurological: Negative.   Psychiatric/Behavioral: Negative.     Per HPI unless specifically indicated above     Objective:    BP 128/86 (BP Location: Left Arm)   Pulse 88   Temp 98.8 F (37.1 C) (Oral)   Ht 5' 8.62" (1.743 m)   Wt 193 lb (87.5 kg)   SpO2 97%   BMI 28.82 kg/m   Wt Readings from Last 3 Encounters:  08/04/20 193 lb (87.5 kg)  06/30/20 191 lb 6.4 oz (86.8 kg)  07/16/19 185 lb (83.9 kg)    Physical Exam Vitals and nursing note reviewed.  Constitutional:      General: He is awake. He is not in acute distress.    Appearance: He is well-developed, well-groomed and overweight. He is not ill-appearing or toxic-appearing.  HENT:     Head: Normocephalic and atraumatic.     Right Ear: Hearing normal. No drainage.     Left Ear: Hearing normal. No drainage.  Eyes:     General: Lids are normal.        Right eye: No discharge.        Left eye: No discharge.     Conjunctiva/sclera: Conjunctivae normal.     Pupils: Pupils are equal, round, and reactive to light.  Neck:     Thyroid: No thyromegaly.     Vascular: No carotid bruit.     Trachea: Trachea normal.  Cardiovascular:     Rate and Rhythm: Normal rate and regular rhythm.     Heart sounds: Normal heart sounds, S1 normal and S2 normal. No murmur heard. No gallop.   Pulmonary:     Effort: Pulmonary effort is normal. No accessory muscle usage or respiratory distress.     Breath sounds: Normal breath sounds.  Abdominal:     General: Bowel sounds are normal.     Palpations: Abdomen is soft.  Musculoskeletal:        General: Normal range of motion.     Cervical back: Normal range of motion and neck supple.     Lumbar back: Swelling (right lower), signs of trauma (right lower), laceration and tenderness present. No spasms or bony tenderness. Normal range of motion.       Back:     Right lower leg: No edema.     Left lower leg: No edema.     Comments: Area  of bruising on back mild tenderness to palpation.  Area approx 3 fingerbreadth below lower ribs -- bruising extending from mid-lower back along old scar line on right side.  Mild edema present.  Full ROM, up on to exam table without issue.  Skin:    General: Skin is warm and dry.     Capillary Refill: Capillary refill takes less than 2 seconds.     Findings: No rash.  Neurological:     Mental Status: He is alert and oriented to person, place, and time.     Cranial Nerves: Cranial nerves are intact.     Deep Tendon Reflexes: Reflexes are normal and symmetric.     Reflex Scores:      Brachioradialis reflexes are 2+ on the right side and 2+ on the left side.      Patellar reflexes are 2+ on the right side and 2+ on the left side. Psychiatric:        Attention and Perception: Attention normal.        Mood and Affect: Mood normal.        Speech: Speech normal.        Behavior: Behavior normal. Behavior is cooperative.        Thought Content: Thought content normal.     Results for orders placed or performed in visit on 06/30/20  Microscopic Examination   Urine  Result Value Ref Range   WBC, UA None seen 0 - 5 /hpf   RBC 0-2 0 - 2 /hpf   Epithelial Cells (non renal) 0-10 0 - 10 /hpf   Bacteria, UA None seen None seen/Few  Microalbumin, Urine Waived  Result Value Ref Range   Microalb, Ur Waived 10 0 - 19 mg/L   Creatinine, Urine Waived 50 10 - 300 mg/dL   Microalb/Creat Ratio <30 <30 mg/g  Comprehensive metabolic panel  Result Value Ref Range   Glucose 95 65 - 99 mg/dL   BUN 17 6 - 24 mg/dL   Creatinine, Ser 1.68 0.76 - 1.27 mg/dL   GFR calc non Af Amer 71 >59 mL/min/1.73   GFR calc Af Amer 82 >59 mL/min/1.73   BUN/Creatinine Ratio 15 9 - 20   Sodium 138 134 - 144 mmol/L   Potassium 4.2 3.5 - 5.2 mmol/L   Chloride 103 96 - 106 mmol/L   CO2 22 20 - 29 mmol/L   Calcium 9.5 8.7 - 10.2 mg/dL   Total Protein 6.9 6.0 - 8.5 g/dL   Albumin 4.7 3.8 - 4.9 g/dL  Globulin, Total 2.2  1.5 - 4.5 g/dL   Albumin/Globulin Ratio 2.1 1.2 - 2.2   Bilirubin Total 0.9 0.0 - 1.2 mg/dL   Alkaline Phosphatase 64 44 - 121 IU/L   AST 22 0 - 40 IU/L   ALT 23 0 - 44 IU/L  CBC with Differential/Platelet  Result Value Ref Range   WBC 5.9 3.4 - 10.8 x10E3/uL   RBC 4.98 4.14 - 5.80 x10E6/uL   Hemoglobin 15.1 13.0 - 17.7 g/dL   Hematocrit 87.5 79.7 - 51.0 %   MCV 89 79 - 97 fL   MCH 30.3 26.6 - 33.0 pg   MCHC 34.2 31.5 - 35.7 g/dL   RDW 28.2 06.0 - 15.6 %   Platelets 242 150 - 450 x10E3/uL   Neutrophils 67 Not Estab. %   Lymphs 20 Not Estab. %   Monocytes 9 Not Estab. %   Eos 2 Not Estab. %   Basos 1 Not Estab. %   Neutrophils Absolute 4.1 1.4 - 7.0 x10E3/uL   Lymphocytes Absolute 1.2 0.7 - 3.1 x10E3/uL   Monocytes Absolute 0.5 0.1 - 0.9 x10E3/uL   EOS (ABSOLUTE) 0.1 0.0 - 0.4 x10E3/uL   Basophils Absolute 0.0 0.0 - 0.2 x10E3/uL   Immature Granulocytes 1 Not Estab. %   Immature Grans (Abs) 0.0 0.0 - 0.1 x10E3/uL  Lipid Panel w/o Chol/HDL Ratio  Result Value Ref Range   Cholesterol, Total 217 (H) 100 - 199 mg/dL   Triglycerides 153 0 - 149 mg/dL   HDL 44 >79 mg/dL   VLDL Cholesterol Cal 23 5 - 40 mg/dL   LDL Chol Calc (NIH) 432 (H) 0 - 99 mg/dL  PSA  Result Value Ref Range   Prostate Specific Ag, Serum 3.1 0.0 - 4.0 ng/mL  TSH  Result Value Ref Range   TSH 1.200 0.450 - 4.500 uIU/mL  Urinalysis, Routine w reflex microscopic  Result Value Ref Range   Specific Gravity, UA 1.015 1.005 - 1.030   pH, UA 5.5 5.0 - 7.5   Color, UA Yellow Yellow   Appearance Ur Clear Clear   Leukocytes,UA Negative Negative   Protein,UA Negative Negative/Trace   Glucose, UA Negative Negative   Ketones, UA Negative Negative   RBC, UA Trace (A) Negative   Bilirubin, UA Negative Negative   Urobilinogen, Ur 0.2 0.2 - 1.0 mg/dL   Nitrite, UA Negative Negative   Microscopic Examination See below:       Assessment & Plan:   Problem List Items Addressed This Visit      Other   S/P lumbar  fusion    This is his main concern today, concern about fall causing issue to hardware.  At this time he wishes to defer imaging until edema improved and then agrees to obtain imaging at that point to assess hardware and bony structures.  Recommend return in 1 weeks, he wishes to wait 2 weeks for return.      Fall on steps    With injury to right lower back about 3 fingerbreadth below lower ribs.  He wishes to defer imaging at this time until edema has decreased, then will consider imaging.  At this time reports pain well-controlled. Will add on Tizanidine to help with muscle pain, script sent.  Recommend he continue to use ice every few hours to lower back and ensure plenty of rest, no heavy lifting.  Recommend cut back on Ibuprofen due to elevation in initial BP today and alternate with Tylenol.  Plan for  return in 2 weeks for follow-up, but is aware if any worsening symptoms to return sooner.  Accidental injury paperwork filled out today.          Follow up plan: Return in about 2 weeks (around 08/18/2020) for Back assessment -- back pain.

## 2020-08-04 NOTE — Assessment & Plan Note (Signed)
With injury to right lower back about 3 fingerbreadth below lower ribs.  He wishes to defer imaging at this time until edema has decreased, then will consider imaging.  At this time reports pain well-controlled. Will add on Tizanidine to help with muscle pain, script sent.  Recommend he continue to use ice every few hours to lower back and ensure plenty of rest, no heavy lifting.  Recommend cut back on Ibuprofen due to elevation in initial BP today and alternate with Tylenol.  Plan for return in 2 weeks for follow-up, but is aware if any worsening symptoms to return sooner.  Accidental injury paperwork filled out today.

## 2020-08-04 NOTE — Assessment & Plan Note (Signed)
This is his main concern today, concern about fall causing issue to hardware.  At this time he wishes to defer imaging until edema improved and then agrees to obtain imaging at that point to assess hardware and bony structures.  Recommend return in 1 weeks, he wishes to wait 2 weeks for return.

## 2020-08-04 NOTE — Patient Instructions (Signed)
Acute Back Pain, Adult Acute back pain is sudden and usually short-lived. It is often caused by an injury to the muscles and tissues in the back. The injury may result from:  A muscle or ligament getting overstretched or torn (strained). Ligaments are tissues that connect bones to each other. Lifting something improperly can cause a back strain.  Wear and tear (degeneration) of the spinal disks. Spinal disks are circular tissue that provide cushioning between the bones of the spine (vertebrae).  Twisting motions, such as while playing sports or doing yard work.  A hit to the back.  Arthritis. You may have a physical exam, lab tests, and imaging tests to find the cause of your pain. Acute back pain usually goes away with rest and home care. Follow these instructions at home: Managing pain, stiffness, and swelling  Treatment may include medicines for pain and inflammation that are taken by mouth or applied to the skin, prescription pain medicine, or muscle relaxants. Take over-the-counter and prescription medicines only as told by your health care provider.  Your health care provider may recommend applying ice during the first 24-48 hours after your pain starts. To do this: ? Put ice in a plastic bag. ? Place a towel between your skin and the bag. ? Leave the ice on for 20 minutes, 2-3 times a day.  If directed, apply heat to the affected area as often as told by your health care provider. Use the heat source that your health care provider recommends, such as a moist heat pack or a heating pad. ? Place a towel between your skin and the heat source. ? Leave the heat on for 20-30 minutes. ? Remove the heat if your skin turns bright red. This is especially important if you are unable to feel pain, heat, or cold. You have a greater risk of getting burned. Activity  Do not stay in bed. Staying in bed for more than 1-2 days can delay your recovery.  Sit up and stand up straight. Avoid leaning  forward when you sit or hunching over when you stand. ? If you work at a desk, sit close to it so you do not need to lean over. Keep your chin tucked in. Keep your neck drawn back, and keep your elbows bent at a 90-degree angle (right angle). ? Sit high and close to the steering wheel when you drive. Add lower back (lumbar) support to your car seat, if needed.  Take short walks on even surfaces as soon as you are able. Try to increase the length of time you walk each day.  Do not sit, drive, or stand in one place for more than 30 minutes at a time. Sitting or standing for long periods of time can put stress on your back.  Do not drive or use heavy machinery while taking prescription pain medicine.  Use proper lifting techniques. When you bend and lift, use positions that put less stress on your back: ? Bend your knees. ? Keep the load close to your body. ? Avoid twisting.  Exercise regularly as told by your health care provider. Exercising helps your back heal faster and helps prevent back injuries by keeping muscles strong and flexible.  Work with a physical therapist to make a safe exercise program, as recommended by your health care provider. Do any exercises as told by your physical therapist.   Lifestyle  Maintain a healthy weight. Extra weight puts stress on your back and makes it difficult to have   good posture.  Avoid activities or situations that make you feel anxious or stressed. Stress and anxiety increase muscle tension and can make back pain worse. Learn ways to manage anxiety and stress, such as through exercise. General instructions  Sleep on a firm mattress in a comfortable position. Try lying on your side with your knees slightly bent. If you lie on your back, put a pillow under your knees.  Follow your treatment plan as told by your health care provider. This may include: ? Cognitive or behavioral therapy. ? Acupuncture or massage therapy. ? Meditation or yoga. Contact  a health care provider if:  You have pain that is not relieved with rest or medicine.  You have increasing pain going down into your legs or buttocks.  Your pain does not improve after 2 weeks.  You have pain at night.  You lose weight without trying.  You have a fever or chills. Get help right away if:  You develop new bowel or bladder control problems.  You have unusual weakness or numbness in your arms or legs.  You develop nausea or vomiting.  You develop abdominal pain.  You feel faint. Summary  Acute back pain is sudden and usually short-lived.  Use proper lifting techniques. When you bend and lift, use positions that put less stress on your back.  Take over-the-counter and prescription medicines and apply heat or ice as directed by your health care provider. This information is not intended to replace advice given to you by your health care provider. Make sure you discuss any questions you have with your health care provider. Document Revised: 03/03/2020 Document Reviewed: 03/03/2020 Elsevier Patient Education  2021 [employer] Inc.  

## 2020-08-07 ENCOUNTER — Other Ambulatory Visit: Payer: Self-pay | Admitting: Family Medicine

## 2020-08-07 MED ORDER — IBUPROFEN 800 MG PO TABS
800.0000 mg | ORAL_TABLET | Freq: Three times a day (TID) | ORAL | 0 refills | Status: DC | PRN
Start: 2020-08-07 — End: 2021-07-17

## 2020-08-07 NOTE — Telephone Encounter (Signed)
See note below

## 2020-08-07 NOTE — Telephone Encounter (Signed)
Medication Refill - Medication: ibuprofen (ADVIL,MOTRIN) 800 MG tablet     Preferred Pharmacy (with phone number or street name):  [id number] [place] [address] Phone:  [phone]  Fax:  [phone]       Agent: Please be advised that RX refills may take up to 3 business days. We ask that you follow-up with your pharmacy.

## 2020-08-15 DIAGNOSIS — Z1152 Encounter for screening for COVID-19: Secondary | ICD-10-CM | POA: Diagnosis not present

## 2020-08-21 ENCOUNTER — Ambulatory Visit: Payer: BC Managed Care – PPO | Admitting: Family Medicine

## 2020-08-22 DIAGNOSIS — Z1152 Encounter for screening for COVID-19: Secondary | ICD-10-CM | POA: Diagnosis not present

## 2020-09-08 DIAGNOSIS — M545 Low back pain, unspecified: Secondary | ICD-10-CM | POA: Diagnosis not present

## 2020-09-08 DIAGNOSIS — Z981 Arthrodesis status: Secondary | ICD-10-CM | POA: Diagnosis not present

## 2021-06-25 ENCOUNTER — Other Ambulatory Visit: Payer: Self-pay | Admitting: Nurse Practitioner

## 2021-06-27 NOTE — Telephone Encounter (Signed)
Requested Prescriptions  Pending Prescriptions Disp Refills   omeprazole (PRILOSEC) 20 MG capsule [Pharmacy Med Name: OMEPRAZOLE DR 20 MG CAPSULE] 90 capsule 3    Sig: TAKE 1 CAPSULE BY MOUTH EVERY DAY     Gastroenterology: Proton Pump Inhibitors Passed - 06/25/2021  6:52 PM      Passed - Valid encounter within last 12 months    Recent Outpatient Visits          10 months ago S/P lumbar fusion   [facility] [place], Dorie Rank, NP   12 months ago Routine general medical examination at a health care facility   [facility] [place], [place], DO   1 year ago Cough   [facility] [place], [place]   2 years ago Sore throat   [facility] [place], [place], DO   2 years ago Routine general medical examination at a health care facility   [facility] [place], Oralia Rud, DO      Future Appointments            In 1 week Laural Benes, Oralia Rud, DO [facility], [facility]

## 2021-06-29 ENCOUNTER — Other Ambulatory Visit: Payer: Self-pay | Admitting: Family Medicine

## 2021-06-29 NOTE — Telephone Encounter (Signed)
Requested Prescriptions  Pending Prescriptions Disp Refills   fluticasone (FLONASE) 50 MCG/ACT nasal spray [Pharmacy Med Name: FLUTICASONE PROP 50 MCG SPRAY] 48 mL 2    Sig: PLACE 1 SPRAY INTO BOTH NOSTRILS 2 (TWO) TIMES DAILY     Ear, Nose, and Throat: Nasal Preparations - Corticosteroids Passed - 06/29/2021  1:41 AM      Passed - Valid encounter within last 12 months    Recent Outpatient Visits          10 months ago S/P lumbar fusion   [facility] [place], Corrie Dandy T, NP   12 months ago Routine general medical examination at a health care facility   [facility] [place], [place], DO   1 year ago Cough   [facility] Part[place] N[place], [place]   2 years ago Sore throat   [facility] [place], [place], DO   2 years ago Routine general medical examination at a health care facility   [facility] [place], Oralia Rud, DO      Future Appointments            In 2 weeks Laural Benes, Oralia Rud, DO [facility], [facility]

## 2021-07-06 ENCOUNTER — Encounter: Payer: BC Managed Care – PPO | Admitting: Family Medicine

## 2021-07-17 ENCOUNTER — Other Ambulatory Visit: Payer: Self-pay

## 2021-07-17 ENCOUNTER — Encounter: Payer: Self-pay | Admitting: Family Medicine

## 2021-07-17 ENCOUNTER — Ambulatory Visit (INDEPENDENT_AMBULATORY_CARE_PROVIDER_SITE_OTHER): Payer: BC Managed Care – PPO | Admitting: Family Medicine

## 2021-07-17 VITALS — BP 143/94 | HR 93 | Temp 98.4°F | Ht 68.5 in | Wt 191.8 lb

## 2021-07-17 DIAGNOSIS — Z0000 Encounter for general adult medical examination without abnormal findings: Secondary | ICD-10-CM

## 2021-07-17 DIAGNOSIS — K2101 Gastro-esophageal reflux disease with esophagitis, with bleeding: Secondary | ICD-10-CM

## 2021-07-17 DIAGNOSIS — Z23 Encounter for immunization: Secondary | ICD-10-CM

## 2021-07-17 DIAGNOSIS — Z136 Encounter for screening for cardiovascular disorders: Secondary | ICD-10-CM | POA: Diagnosis not present

## 2021-07-17 LAB — URINALYSIS, ROUTINE W REFLEX MICROSCOPIC
Bilirubin, UA: NEGATIVE
Glucose, UA: NEGATIVE
Ketones, UA: NEGATIVE
Leukocytes,UA: NEGATIVE
Nitrite, UA: NEGATIVE
Protein,UA: NEGATIVE
RBC, UA: NEGATIVE
Specific Gravity, UA: 1.01 (ref 1.005–1.030)
Urobilinogen, Ur: 0.2 mg/dL (ref 0.2–1.0)
pH, UA: 6 (ref 5.0–7.5)

## 2021-07-17 MED ORDER — SUCRALFATE 1 GM/10ML PO SUSP: 1.0000 g | Freq: Three times a day (TID) | ORAL | 0 refills | Status: DC

## 2021-07-17 MED ORDER — MONTELUKAST SODIUM 10 MG PO TABS: ORAL_TABLET | ORAL | 3 refills | Status: DC

## 2021-07-17 MED ORDER — OMEPRAZOLE 40 MG PO CPDR: DELAYED_RELEASE_CAPSULE | ORAL | 1 refills | Status: DC

## 2021-07-17 NOTE — Assessment & Plan Note (Signed)
Not doing well. Will get him into GI and start daily omeprazole and carafate. Continue to monitor. Call with any concerns.

## 2021-07-17 NOTE — Progress Notes (Signed)
BP (!) 143/94    Pulse 93    Temp 98.4 F (36.9 C)    Ht 5' 8.5" (1.74 m)    Wt 191 lb 12.8 oz (87 kg)    SpO2 98%    BMI 28.74 kg/m    Subjective:    Patient ID: Andres Dillon, male    DOB: 14-Oct-1960, 61 y.o.   MRN: [id number]  HPI: Andres Dillon is a 61 y.o. male presenting on 07/17/2021 for comprehensive medical examination. Current medical complaints include:  Has been having pain in his throat. He has been having some blood-tinged spit.   Interim Problems from his last visit: no  Depression Screen done today and results listed below:  Depression screen [facility] 2/9 07/17/2021 08/04/2020 06/30/2020 05/27/2019 01/01/2018  Decreased Interest 1 3 1  0 1  Down, Depressed, Hopeless 2 2 1  0 1  PHQ - 2 Score 3 5 2  0 2  Altered sleeping 1 3 1  - 1  Tired, decreased energy 1 2 0 - 2  Change in appetite 1 0 0 - 0  Feeling bad or failure about yourself  0 2 0 - 0  Trouble concentrating 0 2 0 - 0  Moving slowly or fidgety/restless 0 0 0 - 0  Suicidal thoughts 0 0 0 - 0  PHQ-9 Score 6 14 3  - 5  Difficult doing work/chores - - Somewhat difficult - -    Past Medical History:  Past Medical History:  Diagnosis Date   Arthritis    GERD (gastroesophageal reflux disease)     Surgical History:  Past Surgical History:  Procedure Laterality Date   COLONOSCOPY  2014   SPINE SURGERY  12/23/1995   fell/ rods and screws T10 /L2 fusions    Medications:  Current Outpatient Medications on File Prior to Visit  Medication Sig   fluticasone (FLONASE) 50 MCG/ACT nasal spray PLACE 1 SPRAY INTO BOTH NOSTRILS 2 (TWO) TIMES DAILY   No current facility-administered medications on file prior to visit.    Allergies:  No Known Allergies  Social History:  Social History   Socioeconomic History   Marital status: Married    Spouse name: Not on file   Number of children: Not on file   Years of education: Not on file   Highest education level: Not on file  Occupational History   Not on file  Tobacco  Use   Smoking status: Never   Smokeless tobacco: Never  Vaping Use   Vaping Use: Never used  Substance and Sexual Activity   Alcohol use: Yes    Alcohol/week: 12.0 standard drinks    Types: 12 Cans of beer per week   Drug use: No   Sexual activity: Not on file  Other Topics Concern   Not on file  Social History Narrative   Not on file   Social Determinants of Health   Financial Resource Strain: Not on file  Food Insecurity: Not on file  Transportation Needs: Not on file  Physical Activity: Not on file  Stress: Not on file  Social Connections: Not on file  Intimate Partner Violence: Not on file   Social History   Tobacco Use  Smoking Status Never  Smokeless Tobacco Never   Social History   Substance and Sexual Activity  Alcohol Use Yes   Alcohol/week: 12.0 standard drinks   Types: 12 Cans of beer per week    Family History:  Family History  Problem Relation Age of Onset  Cancer Mother    Cancer Father    Heart disease Father 54       CABG    Kidney disease Father     Past medical history, surgical history, medications, allergies, family history and social history reviewed with patient today and changes made to appropriate areas of the chart.   Review of Systems  Constitutional: Negative.   HENT: Negative.    Eyes: Negative.        Floaters  Respiratory: Negative.    Cardiovascular:  Positive for chest pain. Negative for palpitations, orthopnea, claudication, leg swelling and PND.  Gastrointestinal:  Positive for diarrhea, heartburn and nausea. Negative for abdominal pain, blood in stool, constipation, melena and vomiting.  Genitourinary: Negative.   Musculoskeletal: Negative.   Skin: Negative.   Neurological: Negative.   Endo/Heme/Allergies: Negative.   Psychiatric/Behavioral: Negative.    All other ROS negative except what is listed above and in the HPI.      Objective:    BP (!) 143/94    Pulse 93    Temp 98.4 F (36.9 C)    Ht 5' 8.5" (1.74  m)    Wt 191 lb 12.8 oz (87 kg)    SpO2 98%    BMI 28.74 kg/m   Wt Readings from Last 3 Encounters:  07/17/21 191 lb 12.8 oz (87 kg)  08/04/20 193 lb (87.5 kg)  06/30/20 191 lb 6.4 oz (86.8 kg)    Physical Exam Vitals and nursing note reviewed.  Constitutional:      General: He is not in acute distress.    Appearance: Normal appearance. He is not ill-appearing, toxic-appearing or diaphoretic.  HENT:     Head: Normocephalic and atraumatic.     Right Ear: Tympanic membrane, ear canal and external ear normal. There is no impacted cerumen.     Left Ear: Tympanic membrane, ear canal and external ear normal. There is no impacted cerumen.     Nose: Nose normal. No congestion or rhinorrhea.     Mouth/Throat:     Mouth: Mucous membranes are moist.     Pharynx: Oropharynx is clear. No oropharyngeal exudate or posterior oropharyngeal erythema.  Eyes:     General: No scleral icterus.       Right eye: No discharge.        Left eye: No discharge.     Extraocular Movements: Extraocular movements intact.     Conjunctiva/sclera: Conjunctivae normal.     Pupils: Pupils are equal, round, and reactive to light.  Neck:     Vascular: No carotid bruit.  Cardiovascular:     Rate and Rhythm: Normal rate and regular rhythm.     Pulses: Normal pulses.     Heart sounds: No murmur heard.   No friction rub. No gallop.  Pulmonary:     Effort: Pulmonary effort is normal. No respiratory distress.     Breath sounds: Normal breath sounds. No stridor. No wheezing, rhonchi or rales.  Chest:     Chest wall: No tenderness.  Abdominal:     General: Abdomen is flat. Bowel sounds are normal. There is no distension.     Palpations: Abdomen is soft. There is no mass.     Tenderness: There is no abdominal tenderness. There is no right CVA tenderness, left CVA tenderness, guarding or rebound.     Hernia: No hernia is present.  Genitourinary:    Comments: Breast and pelvic exams deferred with shared decision  making Musculoskeletal:  General: No swelling, tenderness, deformity or signs of injury.     Cervical back: Normal range of motion and neck supple. No rigidity. No muscular tenderness.     Right lower leg: No edema.     Left lower leg: No edema.  Lymphadenopathy:     Cervical: No cervical adenopathy.  Skin:    General: Skin is warm and dry.     Capillary Refill: Capillary refill takes less than 2 seconds.     Coloration: Skin is not jaundiced or pale.     Findings: No bruising, erythema, lesion or rash.  Neurological:     General: No focal deficit present.     Mental Status: He is alert and oriented to person, place, and time. Mental status is at baseline.     Cranial Nerves: No cranial nerve deficit.     Sensory: No sensory deficit.     Motor: No weakness.     Coordination: Coordination normal.     Gait: Gait normal.     Deep Tendon Reflexes: Reflexes normal.  Psychiatric:        Mood and Affect: Mood normal.        Behavior: Behavior normal.        Thought Content: Thought content normal.        Judgment: Judgment normal.    Results for orders placed or performed in visit on 06/30/20  Microscopic Examination   Urine  Result Value Ref Range   WBC, UA None seen 0 - 5 /hpf   RBC 0-2 0 - 2 /hpf   Epithelial Cells (non renal) 0-10 0 - 10 /hpf   Bacteria, UA None seen None seen/Few  Microalbumin, Urine Waived  Result Value Ref Range   Microalb, Ur Waived 10 0 - 19 mg/L   Creatinine, Urine Waived 50 10 - 300 mg/dL   Microalb/Creat Ratio <30 <30 mg/g  Comprehensive metabolic panel  Result Value Ref Range   Glucose 95 65 - 99 mg/dL   BUN 17 6 - 24 mg/dL   Creatinine, Ser 1.13 0.76 - 1.27 mg/dL   GFR calc non Af Amer 71 >59 mL/min/1.73   GFR calc Af Amer 82 >59 mL/min/1.73   BUN/Creatinine Ratio 15 9 - 20   Sodium 138 134 - 144 mmol/L   Potassium 4.2 3.5 - 5.2 mmol/L   Chloride 103 96 - 106 mmol/L   CO2 22 20 - 29 mmol/L   Calcium 9.5 8.7 - 10.2 mg/dL   Total  Protein 6.9 6.0 - 8.5 g/dL   Albumin 4.7 3.8 - 4.9 g/dL   Globulin, Total 2.2 1.5 - 4.5 g/dL   Albumin/Globulin Ratio 2.1 1.2 - 2.2   Bilirubin Total 0.9 0.0 - 1.2 mg/dL   Alkaline Phosphatase 64 44 - 121 IU/L   AST 22 0 - 40 IU/L   ALT 23 0 - 44 IU/L  CBC with Differential/Platelet  Result Value Ref Range   WBC 5.9 3.4 - 10.8 x10E3/uL   RBC 4.98 4.14 - 5.80 x10E6/uL   Hemoglobin 15.1 13.0 - 17.7 g/dL   Hematocrit 44.1 37.5 - 51.0 %   MCV 89 79 - 97 fL   MCH 30.3 26.6 - 33.0 pg   MCHC 34.2 31.5 - 35.7 g/dL   RDW 13.3 11.6 - 15.4 %   Platelets 242 150 - 450 x10E3/uL   Neutrophils 67 Not Estab. %   Lymphs 20 Not Estab. %   Monocytes 9 Not Estab. %   Eos 2  Not Estab. %   Basos 1 Not Estab. %   Neutrophils Absolute 4.1 1.4 - 7.0 x10E3/uL   Lymphocytes Absolute 1.2 0.7 - 3.1 x10E3/uL   Monocytes Absolute 0.5 0.1 - 0.9 x10E3/uL   EOS (ABSOLUTE) 0.1 0.0 - 0.4 x10E3/uL   Basophils Absolute 0.0 0.0 - 0.2 x10E3/uL   Immature Granulocytes 1 Not Estab. %   Immature Grans (Abs) 0.0 0.0 - 0.1 x10E3/uL  Lipid Panel w/o Chol/HDL Ratio  Result Value Ref Range   Cholesterol, Total 217 (H) 100 - 199 mg/dL   Triglycerides 128 0 - 149 mg/dL   HDL 44 >39 mg/dL   VLDL Cholesterol Cal 23 5 - 40 mg/dL   LDL Chol Calc (NIH) 150 (H) 0 - 99 mg/dL  PSA  Result Value Ref Range   Prostate Specific Ag, Serum 3.1 0.0 - 4.0 ng/mL  TSH  Result Value Ref Range   TSH 1.200 0.450 - 4.500 uIU/mL  Urinalysis, Routine w reflex microscopic  Result Value Ref Range   Specific Gravity, UA 1.015 1.005 - 1.030   pH, UA 5.5 5.0 - 7.5   Color, UA Yellow Yellow   Appearance Ur Clear Clear   Leukocytes,UA Negative Negative   Protein,UA Negative Negative/Trace   Glucose, UA Negative Negative   Ketones, UA Negative Negative   RBC, UA Trace (A) Negative   Bilirubin, UA Negative Negative   Urobilinogen, Ur 0.2 0.2 - 1.0 mg/dL   Nitrite, UA Negative Negative   Microscopic Examination See below:       Assessment  & Plan:   Problem List Items Addressed This Visit       Digestive   Gastroesophageal reflux disease with esophagitis    Not doing well. Will get him into GI and start daily omeprazole and carafate. Continue to monitor. Call with any concerns.       Relevant Orders   Ambulatory referral to Gastroenterology   Other Visit Diagnoses     Routine general medical examination at a health care facility    -  Primary   Vaccines declined/up to date. Labs drawn today. Colonoscopy up to date. Continue diet and exercise. Continue to monitor. Call with any concerns.    Relevant Orders   Comprehensive metabolic panel   CBC with Differential/Platelet   Lipid Panel w/o Chol/HDL Ratio   PSA   TSH   Urinalysis, Routine w reflex microscopic       LABORATORY TESTING:  Health maintenance labs ordered today as discussed above.   The natural history of prostate cancer and ongoing controversy regarding screening and potential treatment outcomes of prostate cancer has been discussed with the patient. The meaning of a false positive PSA and a false negative PSA has been discussed. He indicates understanding of the limitations of this screening test and wishes to proceed with screening PSA testing.   IMMUNIZATIONS:   - Tdap: Tetanus vaccination status reviewed: last tetanus booster within 10 years. - Influenza: Refused - Pneumovax: Not applicable - Prevnar: Not applicable - COVID: Refused - HPV: Not applicable - Shingrix vaccine: Administered today  SCREENING: - Colonoscopy: Up to date  Discussed with patient purpose of the colonoscopy is to detect colon cancer at curable precancerous or early stages   PATIENT COUNSELING:    Sexuality: Discussed sexually transmitted diseases, partner selection, use of condoms, avoidance of unintended pregnancy  and contraceptive alternatives.   Advised to avoid cigarette smoking.  I discussed with the patient that most people either abstain from alcohol or  drink within safe limits (<=14/week and <=4 drinks/occasion for males, <=7/weeks and <= 3 drinks/occasion for females) and that the risk for alcohol disorders and other health effects rises proportionally with the number of drinks per week and how often a drinker exceeds daily limits.  Discussed cessation/primary prevention of drug use and availability of treatment for abuse.   Diet: Encouraged to adjust caloric intake to maintain  or achieve ideal body weight, to reduce intake of dietary saturated fat and total fat, to limit sodium intake by avoiding high sodium foods and not adding table salt, and to maintain adequate dietary potassium and calcium preferably from fresh fruits, vegetables, and low-fat dairy products.    stressed the importance of regular exercise  Injury prevention: Discussed safety belts, safety helmets, smoke detector, smoking near bedding or upholstery.   Dental health: Discussed importance of regular tooth brushing, flossing, and dental visits.   Follow up plan: NEXT PREVENTATIVE PHYSICAL DUE IN 1 YEAR. Return in about 1 year (around 07/17/2022), or physical, for 3 months nurse only for 2nd shingrix.

## 2021-07-18 LAB — CBC WITH DIFFERENTIAL/PLATELET
Basophils Absolute: 0.1 10*3/uL (ref 0.0–0.2)
Basos: 1 %
EOS (ABSOLUTE): 0.1 10*3/uL (ref 0.0–0.4)
Eos: 1 %
Hematocrit: 43 % (ref 37.5–51.0)
Hemoglobin: 15.1 g/dL (ref 13.0–17.7)
Immature Grans (Abs): 0.1 10*3/uL (ref 0.0–0.1)
Immature Granulocytes: 1 %
Lymphocytes Absolute: 1.4 10*3/uL (ref 0.7–3.1)
Lymphs: 15 %
MCH: 30.3 pg (ref 26.6–33.0)
MCHC: 35.1 g/dL (ref 31.5–35.7)
MCV: 86 fL (ref 79–97)
Monocytes Absolute: 0.7 10*3/uL (ref 0.1–0.9)
Monocytes: 8 %
Neutrophils Absolute: 6.7 10*3/uL (ref 1.4–7.0)
Neutrophils: 74 %
Platelets: 262 10*3/uL (ref 150–450)
RBC: 4.98 x10E6/uL (ref 4.14–5.80)
RDW: 13.1 % (ref 11.6–15.4)
WBC: 8.9 10*3/uL (ref 3.4–10.8)

## 2021-07-18 LAB — COMPREHENSIVE METABOLIC PANEL
ALT: 25 IU/L (ref 0–44)
AST: 25 IU/L (ref 0–40)
Albumin/Globulin Ratio: 2 (ref 1.2–2.2)
Albumin: 4.9 g/dL (ref 3.8–4.9)
Alkaline Phosphatase: 65 IU/L (ref 44–121)
BUN/Creatinine Ratio: 14 (ref 10–24)
BUN: 16 mg/dL (ref 8–27)
Bilirubin Total: 0.8 mg/dL (ref 0.0–1.2)
CO2: 18 mmol/L — ABNORMAL LOW (ref 20–29)
Calcium: 9.7 mg/dL (ref 8.6–10.2)
Chloride: 104 mmol/L (ref 96–106)
Creatinine, Ser: 1.15 mg/dL (ref 0.76–1.27)
Globulin, Total: 2.4 g/dL (ref 1.5–4.5)
Glucose: 90 mg/dL (ref 70–99)
Potassium: 4.6 mmol/L (ref 3.5–5.2)
Sodium: 146 mmol/L — ABNORMAL HIGH (ref 134–144)
Total Protein: 7.3 g/dL (ref 6.0–8.5)
eGFR: 73 mL/min/{1.73_m2} (ref >59)

## 2021-07-18 LAB — LIPID PANEL W/O CHOL/HDL RATIO
Cholesterol, Total: 235 mg/dL — ABNORMAL HIGH (ref 100–199)
HDL: 44 mg/dL (ref >39)
LDL Chol Calc (NIH): 152 mg/dL — ABNORMAL HIGH (ref 0–99)
Triglycerides: 213 mg/dL — ABNORMAL HIGH (ref 0–149)
VLDL Cholesterol Cal: 39 mg/dL (ref 5–40)

## 2021-07-18 LAB — PSA: Prostate Specific Ag, Serum: 4.8 ng/mL — ABNORMAL HIGH (ref 0.0–4.0)

## 2021-07-18 LAB — TSH: TSH: 1.36 u[IU]/mL (ref 0.450–4.500)

## 2021-07-23 ENCOUNTER — Other Ambulatory Visit: Payer: Self-pay | Admitting: Family Medicine

## 2021-07-23 DIAGNOSIS — R972 Elevated prostate specific antigen [PSA]: Secondary | ICD-10-CM

## 2021-08-06 ENCOUNTER — Ambulatory Visit: Payer: Self-pay | Admitting: *Deleted

## 2021-08-06 ENCOUNTER — Ambulatory Visit: Payer: Self-pay

## 2021-08-06 NOTE — Telephone Encounter (Signed)
Spoke with patient and informed him of Dr.Johnson's recommendations. Patient verbalized understanding and has no further questions at this time. Patient states he wants to give it a few more days and if it the area hasn't gotten better then he would reach back out to our office.

## 2021-08-06 NOTE — Telephone Encounter (Signed)
Sent prior encounter to clinical.

## 2021-08-06 NOTE — Telephone Encounter (Signed)
Had a physical 2 1/2 weeks ago and got my Shingles vaccine.  Said I would have discomfort for 2-3 days after the shot.    It's continuing to hurt.  I got a text about my chart.   The nurse was going to check with Dr. Laural Benes and call me back but I got this message from MyChart and I don't know how to use that.  I'm to get another shot but I don't think I will get it.     It looks just like my other arm.   No redness or swelling it just aches on the inside where the shot was given.   Reason for Disposition  [1] Over 3 days (72 hours) since shot AND [2] redness, swelling or pain getting worse    Aching on the inside where got the shot.   Nothing visible on the outside.  Answer Assessment - Initial Assessment Questions 1. SYMPTOMS: "What is the main symptom?" (e.g., redness, swelling, pain)      My left arm is aching on the inside from where I got a Shingles shot 2 1/2 weeks ago.   2. ONSET: "When was the vaccine (shot) given?" "How much later did the Left arm begin?" (e.g., hours, days ago)      Left arm 3. SEVERITY: "How bad is it?"      It aches on the inside all the time.  No redness or swelling on the outside.   It looks the same as my other arm. 4. FEVER: "Is there a fever?" If Yes, ask: "What is it, how was it measured, and when did it start?"      Not asked 5. IMMUNIZATIONS GIVEN: "What shots have you recently received?"     First Shingles shot 6. PAST REACTIONS: "Have you reacted to immunizations before?" If Yes, ask: "What happened?"     No 7. OTHER SYMPTOMS: "Do you have any other symptoms?"     No  Protocols used: Immunization Reactions-A-AH

## 2021-08-06 NOTE — Telephone Encounter (Signed)
Unfortunately without seeing it there isn't much I can tell him. I would advise some ice and ibuprofen, but it's been 3 weeks since his shot- so if it's still hurting we should probably see him

## 2021-08-06 NOTE — Telephone Encounter (Signed)
°  Chief Complaint: Called earlier this morning and talked with a triage nurse.   He is c/o aching in his arm from Shingles injection.  (He got a text message via MyChart but he doesn't know how to use MyChart).  Symptoms: Aching inside his left arm all the time since the shot 2/12 weeks ago Frequency: All the time.   Nothing visible on the outside Pertinent Negatives: Patient denies swelling or redness Disposition: [] ED /[] Urgent Care (no appt availability in office) / [] Appointment(In office/virtual)/ []  [place] [facility]/ [] Home Care/ [] Refused Recommended Disposition /[] [place] [facility]/ [x]  Follow-up with PCP Additional Notes: Message sent to Dr Wynetta Emery at [facility].    No response to prior message sent by Linus Orn, RN triage nurse.

## 2021-08-06 NOTE — Telephone Encounter (Signed)
° °  Chief Complaint: Pain left arm Symptoms: Pain Frequency: Started 1 week after shingles injection Pertinent Negatives: Patient denies swelling or redness Disposition: [] ED /[] Urgent Care (no appt availability in office) / [] Appointment(In office/virtual)/ []  [facility]/ [] Home Care/ [] Refused Recommended Disposition /[] [facility]/ [x]  Follow-up with PCP Additional Notes: Declines office visit. Concerned why he is having pain so long after injection. Please advise pt.  Answer Assessment - Initial Assessment Questions 1. SYMPTOMS: "What is the main symptom?" (e.g., redness, swelling, pain)      Pain to site 2. ONSET: "When was the vaccine (shot) given?" "How much later did the pain begin?" (e.g., hours, days ago)      1 week later 3. SEVERITY: "How bad is it?"      Moderate 4. FEVER: "Is there a fever?" If Yes, ask: "What is it, how was it measured, and when did it start?"      No 5. IMMUNIZATIONS GIVEN: "What shots have you recently received?"     Shingles 6. PAST REACTIONS: "Have you reacted to immunizations before?" If Yes, ask: "What happened?"     No 7. OTHER SYMPTOMS: "Do you have any other symptoms?"     No  Protocols used: Immunization Reactions-A-AH

## 2021-09-07 ENCOUNTER — Other Ambulatory Visit: Payer: Self-pay

## 2021-09-07 ENCOUNTER — Telehealth: Payer: Self-pay | Admitting: Family Medicine

## 2021-09-07 MED ORDER — OMEPRAZOLE 40 MG PO CPDR: DELAYED_RELEASE_CAPSULE | ORAL | 1 refills | Status: DC

## 2021-09-07 NOTE — Telephone Encounter (Signed)
Sent via refill request to PCP  ?

## 2021-09-07 NOTE — Telephone Encounter (Signed)
Copied from [place] [id number]. Topic: General - Other ?>> Sep 07, 2021  2:12 PM Tessa Lerner A wrote: ?Reason for CRM: The patient has been in contact with their pharmacy and been told that their omeprazole (PRILOSEC) 40 MG capsule [id number]  was submitted without directions/instructions ? ?The patient would like their prescription resubmitted when possible to  ? ?[id number], [place] [place] [address] ?Phone: [phone] Fax: [phone] ?Hours: Not open 24 hours ? ?Please contact further if needed ?

## 2021-09-07 NOTE — Telephone Encounter (Signed)
Please resend rx, initial rx did not include directions for use.  ?

## 2021-09-11 DIAGNOSIS — K219 Gastro-esophageal reflux disease without esophagitis: Secondary | ICD-10-CM | POA: Insufficient documentation

## 2021-09-11 DIAGNOSIS — Z800 Family history of malignant neoplasm of digestive organs: Secondary | ICD-10-CM | POA: Diagnosis not present

## 2021-09-11 DIAGNOSIS — Z86010 Personal history of colonic polyps: Secondary | ICD-10-CM | POA: Diagnosis not present

## 2021-09-11 DIAGNOSIS — K625 Hemorrhage of anus and rectum: Secondary | ICD-10-CM | POA: Diagnosis not present

## 2021-09-17 ENCOUNTER — Other Ambulatory Visit: Payer: Self-pay | Admitting: Family Medicine

## 2021-09-17 DIAGNOSIS — R1013 Epigastric pain: Secondary | ICD-10-CM

## 2021-09-17 NOTE — Telephone Encounter (Signed)
Patient called in to inform Dr Laural Benes that [facility] and that the medication for stomach ulcer and he stated that they do not have the medication and its not the omeprazole (PRILOSEC) 40 MG capsule  Patient is asking for a call back from Nurse ASAP please at Ph#   [phone] ?

## 2021-09-17 NOTE — Telephone Encounter (Signed)
Copied from CRM [id number]. Topic: General - Other ?>> Sep 17, 2021 12:04 PM Gaetana Michaelis A wrote: ?Reason for CRM: The patient has been told by their pharmacy that a prior authorization is needed for their sucralfate (CARAFATE) 1 GM/10ML suspension [[id number]]  prescription ? ?The patient would like to be notified when the prior authorization is completed  ? ?Please contact further ?

## 2021-09-17 NOTE — Telephone Encounter (Signed)
Copied from [place] [id number] [id number]. Topic: General - Other ?>> Sep 17, 2021 12:08 PM Tessa Lerner A wrote: ?Reason for CRM: The patient shares that they are continuing to notice slight bits of blood in their saliva ? ?The patient previously addressed their concerns with a member of clinical staff and was referred to [facility]  ? ?The patient was encouraged by a family member to request lab orders to test for HPV  ? ?Please contact further when possible ?

## 2021-09-18 NOTE — Telephone Encounter (Signed)
PA initiated via CoverMyMeds for Sucralfate 1MG /10ML  ?Key: [id number] ? ?Patient requesting blood work to test for H. PYLORI due to having a pinkish blood color in his saliva occassionally. Advised patient he would need appointment to further discuss symptoms, patient declined appointment. Patient states he told PCP at last visit and was referred to GI. Has appointment with GI in early July. Does not want to move appointment up due to insurance reasons.  ?

## 2021-09-18 NOTE — Telephone Encounter (Signed)
Spoke with patient, he states he was able to get omeperazole filled. Was told by pharmacy he needs a PA for sucralfate, see other encounter for PA.  ?

## 2021-09-18 NOTE — Telephone Encounter (Signed)
Order for stool study in. Will need to come pick up sample kit.  ?

## 2021-09-20 ENCOUNTER — Other Ambulatory Visit: Payer: Self-pay | Admitting: Family Medicine

## 2021-09-20 MED ORDER — SUCRALFATE 1 GM/10ML PO SUSP: 1.0000 g | Freq: Three times a day (TID) | ORAL | 0 refills | Status: DC

## 2021-09-20 MED ORDER — SUCRALFATE 1 G PO TABS: 1.0000 g | ORAL_TABLET | Freq: Three times a day (TID) | ORAL | 1 refills | Status: DC

## 2021-09-20 NOTE — Addendum Note (Signed)
Addended by: Leward Quan A on: 09/20/2021 04:13 PM ? ? Modules accepted: Orders ? ?

## 2021-09-20 NOTE — Addendum Note (Signed)
Addended by: Valerie Roys on: 09/20/2021 04:37 PM ? ? Modules accepted: Orders ? ?

## 2021-09-20 NOTE — Telephone Encounter (Signed)
PA for sucralfate suspension denied via CoverMyMeds. Preferred alternative: sucralfate 1gram tablets.  ?

## 2021-09-20 NOTE — Telephone Encounter (Signed)
Spoke with patient and made him aware of Dr.Johnson's recommendations. Patient states he wants to know if he can wait until after he has his scheduled procedures with Gastroenterologist. Patient states he feels a little better since started the Omeprazole. Patient is inquiring if the medication he was told he should take for his ulcers was [facility]. I informed patient I did not see any medication regarding ulcers in his chart. Patient states [facility] was [facility] and see [facility].  ?

## 2021-09-20 NOTE — Telephone Encounter (Signed)
Pt called in stating he was needing to call back to give some information, pt states the medication was sucralfate (CARAFATE) 1 GM/10ML suspension  and it was prescribed by Dr. Laural Benes, and pharmacy is needing a PA.  ?

## 2021-09-21 NOTE — Telephone Encounter (Signed)
Patient is aware of medication at pharmacy. No further questions.  ?

## 2021-10-02 ENCOUNTER — Ambulatory Visit: Payer: BC Managed Care – PPO | Admitting: Unknown Physician Specialty

## 2021-10-02 ENCOUNTER — Encounter: Payer: Self-pay | Admitting: Unknown Physician Specialty

## 2021-10-02 VITALS — BP 151/103 | HR 109 | Temp 97.9°F | Wt 183.4 lb

## 2021-10-02 DIAGNOSIS — K640 First degree hemorrhoids: Secondary | ICD-10-CM

## 2021-10-02 NOTE — Patient Instructions (Signed)
Warm water soaks. ? ?Follow with Tucks wipes ? ?Follow with antibiotic ointment ?

## 2021-10-02 NOTE — Progress Notes (Signed)
? ?BP (!) 151/103   Pulse (!) 109   Temp 97.9 ?F (36.6 ?C) (Oral)   Wt 183 lb 6.4 oz (83.2 kg)   SpO2 98%   BMI 27.48 kg/m?   ? ?Subjective:  ? ? Patient ID: Andres Dillon, male    DOB: 21-Sep-1960, 61 y.o.   MRN: [id number] ? ?HPI: ?Andres Dillon is a 61 y.o. male ? ?Chief Complaint  ?Patient presents with  ? Rectal Pain  ?  States he has something hard near his rectum, pt reports that the mass is not fluid like, does not feel soft- it is harder to the touch. States pain when wiping but denies rectal bleeding. States having a lot of gas recently too.   ? ?Pt noticing a lump for 4 days.  States it is bothering him when he wipes. No fever or bowel changes.   ? ?Relevant past medical, surgical, family and social history reviewed and updated as indicated. Interim medical history since our last visit reviewed. ?Allergies and medications reviewed and updated. ? ?Review of Systems ? ?Per HPI unless specifically indicated above ? ?   ?Objective:  ?  ?BP (!) 151/103   Pulse (!) 109   Temp 97.9 ?F (36.6 ?C) (Oral)   Wt 183 lb 6.4 oz (83.2 kg)   SpO2 98%   BMI 27.48 kg/m?   ?Wt Readings from Last 3 Encounters:  ?10/02/21 183 lb 6.4 oz (83.2 kg)  ?07/17/21 191 lb 12.8 oz (87 kg)  ?08/04/20 193 lb (87.5 kg)  ?  ?Physical Exam ?Constitutional:   ?   General: He is not in acute distress. ?   Appearance: Normal appearance. He is well-developed.  ?HENT:  ?   Head: Normocephalic and atraumatic.  ?Eyes:  ?   General: Lids are normal. No scleral icterus.    ?   Right eye: No discharge.     ?   Left eye: No discharge.  ?   Conjunctiva/sclera: Conjunctivae normal.  ?Cardiovascular:  ?   Rate and Rhythm: Normal rate.  ?Pulmonary:  ?   Effort: Pulmonary effort is normal.  ?Abdominal:  ?   Palpations: There is no hepatomegaly or splenomegaly.  ?Musculoskeletal:     ?   General: Normal range of motion.  ?Skin: ?   Coloration: Skin is not pale.  ?   Findings: No rash.  ?   Comments: Small 2 cm cyst near rectum.    ?Neurological:  ?    Mental Status: He is alert and oriented to person, place, and time.  ?Psychiatric:     ?   Behavior: Behavior normal.     ?   Thought Content: Thought content normal.     ?   Judgment: Judgment normal.  ? ? ?Results for orders placed or performed in visit on 07/17/21  ?Comprehensive metabolic panel  ?Result Value Ref Range  ? Glucose 90 70 - 99 mg/dL  ? BUN 16 8 - 27 mg/dL  ? Creatinine, Ser 1.15 0.76 - 1.27 mg/dL  ? eGFR 73 >59 mL/min/1.73  ? BUN/Creatinine Ratio 14 10 - 24  ? Sodium 146 (H) 134 - 144 mmol/L  ? Potassium 4.6 3.5 - 5.2 mmol/L  ? Chloride 104 96 - 106 mmol/L  ? CO2 18 (L) 20 - 29 mmol/L  ? Calcium 9.7 8.6 - 10.2 mg/dL  ? Total Protein 7.3 6.0 - 8.5 g/dL  ? Albumin 4.9 3.8 - 4.9 g/dL  ? Globulin, Total 2.4 1.5 -  4.5 g/dL  ? Albumin/Globulin Ratio 2.0 1.2 - 2.2  ? Bilirubin Total 0.8 0.0 - 1.2 mg/dL  ? Alkaline Phosphatase 65 44 - 121 IU/L  ? AST 25 0 - 40 IU/L  ? ALT 25 0 - 44 IU/L  ?CBC with Differential/Platelet  ?Result Value Ref Range  ? WBC 8.9 3.4 - 10.8 x10E3/uL  ? RBC 4.98 4.14 - 5.80 x10E6/uL  ? Hemoglobin 15.1 13.0 - 17.7 g/dL  ? Hematocrit 43.0 37.5 - 51.0 %  ? MCV 86 79 - 97 fL  ? MCH 30.3 26.6 - 33.0 pg  ? MCHC 35.1 31.5 - 35.7 g/dL  ? RDW 13.1 11.6 - 15.4 %  ? Platelets 262 150 - 450 x10E3/uL  ? Neutrophils 74 Not Estab. %  ? Lymphs 15 Not Estab. %  ? Monocytes 8 Not Estab. %  ? Eos 1 Not Estab. %  ? Basos 1 Not Estab. %  ? Neutrophils Absolute 6.7 1.4 - 7.0 x10E3/uL  ? Lymphocytes Absolute 1.4 0.7 - 3.1 x10E3/uL  ? Monocytes Absolute 0.7 0.1 - 0.9 x10E3/uL  ? EOS (ABSOLUTE) 0.1 0.0 - 0.4 x10E3/uL  ? Basophils Absolute 0.1 0.0 - 0.2 x10E3/uL  ? Immature Granulocytes 1 Not Estab. %  ? Immature Grans (Abs) 0.1 0.0 - 0.1 x10E3/uL  ?Lipid Panel w/o Chol/HDL Ratio  ?Result Value Ref Range  ? Cholesterol, Total 235 (H) 100 - 199 mg/dL  ? Triglycerides 213 (H) 0 - 149 mg/dL  ? HDL 44 >39 mg/dL  ? VLDL Cholesterol Cal 39 5 - 40 mg/dL  ? LDL Chol Calc (NIH) 152 (H) 0 - 99 mg/dL  ?PSA  ?Result  Value Ref Range  ? Prostate Specific Ag, Serum 4.8 (H) 0.0 - 4.0 ng/mL  ?TSH  ?Result Value Ref Range  ? TSH 1.360 0.450 - 4.500 uIU/mL  ?Urinalysis, Routine w reflex microscopic  ?Result Value Ref Range  ? Specific Gravity, UA 1.010 1.005 - 1.030  ? pH, UA 6.0 5.0 - 7.5  ? Color, UA Yellow Yellow  ? Appearance Ur Clear Clear  ? Leukocytes,UA Negative Negative  ? Protein,UA Negative Negative/Trace  ? Glucose, UA Negative Negative  ? Ketones, UA Negative Negative  ? RBC, UA Negative Negative  ? Bilirubin, UA Negative Negative  ? Urobilinogen, Ur 0.2 0.2 - 1.0 mg/dL  ? Nitrite, UA Negative Negative  ? ?   ?Assessment & Plan:  ? ?Problem List Items Addressed This Visit   ?None ?Visit Diagnoses   ? ? Grade I hemorrhoids    -  Primary  ? Discussed watchful waiting vs I&D.  Pt chooes I&D  ? ?  ?  ?After using a betadine and alcohol preparation, area infiltrated with 1% Lidocaine with epinephrine.  Using a #11 blade, lesion lanced and small 1-3 mm incision made.  Expressed a small amount of serous drainage.   ? ? ?Follow up plan: ?Return if symptoms worsen or fail to improve. ? ? ? ? ? ?

## 2021-10-10 ENCOUNTER — Telehealth: Payer: Self-pay | Admitting: Family Medicine

## 2021-10-10 NOTE — Telephone Encounter (Signed)
Copied from CRM [id number]. Topic: General - Other ?>> Oct 10, 2021  8:29 AM Jaquita Rector A wrote: ?Reason for CRM: Patient called in and cancelled his appointment for his second Shingles vaccine want Dr Laural Benes to know that he will not get this second shot since he had such a hard time recovering from the effects of the first one. Say if there is any reason that he need to come get the second shot please call and advise. Ph# [phone] ?

## 2021-10-10 NOTE — Telephone Encounter (Signed)
FYI to provider

## 2021-10-10 NOTE — Telephone Encounter (Signed)
Noted  

## 2021-10-15 ENCOUNTER — Ambulatory Visit: Payer: BC Managed Care – PPO

## 2021-12-04 ENCOUNTER — Ambulatory Visit: Payer: BC Managed Care – PPO | Admitting: Unknown Physician Specialty

## 2021-12-27 DIAGNOSIS — K640 First degree hemorrhoids: Secondary | ICD-10-CM | POA: Diagnosis not present

## 2021-12-27 DIAGNOSIS — D122 Benign neoplasm of ascending colon: Secondary | ICD-10-CM | POA: Diagnosis not present

## 2021-12-27 DIAGNOSIS — Z86010 Personal history of colonic polyps: Secondary | ICD-10-CM | POA: Diagnosis not present

## 2021-12-27 DIAGNOSIS — Z800 Family history of malignant neoplasm of digestive organs: Secondary | ICD-10-CM | POA: Diagnosis not present

## 2021-12-27 DIAGNOSIS — K449 Diaphragmatic hernia without obstruction or gangrene: Secondary | ICD-10-CM | POA: Diagnosis not present

## 2021-12-27 DIAGNOSIS — K219 Gastro-esophageal reflux disease without esophagitis: Secondary | ICD-10-CM | POA: Diagnosis not present

## 2021-12-31 LAB — HM COLONOSCOPY

## 2022-06-20 DIAGNOSIS — J029 Acute pharyngitis, unspecified: Secondary | ICD-10-CM | POA: Diagnosis not present

## 2022-06-20 DIAGNOSIS — U071 COVID-19: Secondary | ICD-10-CM | POA: Diagnosis not present

## 2022-06-20 DIAGNOSIS — J22 Unspecified acute lower respiratory infection: Secondary | ICD-10-CM | POA: Diagnosis not present

## 2022-06-25 DIAGNOSIS — J069 Acute upper respiratory infection, unspecified: Secondary | ICD-10-CM | POA: Diagnosis not present

## 2022-06-28 ENCOUNTER — Encounter: Payer: Self-pay | Admitting: Physician Assistant

## 2022-06-28 ENCOUNTER — Ambulatory Visit: Payer: BC Managed Care – PPO | Admitting: Physician Assistant

## 2022-06-28 ENCOUNTER — Telehealth (INDEPENDENT_AMBULATORY_CARE_PROVIDER_SITE_OTHER): Payer: BC Managed Care – PPO | Admitting: Physician Assistant

## 2022-06-28 DIAGNOSIS — J0100 Acute maxillary sinusitis, unspecified: Secondary | ICD-10-CM | POA: Diagnosis not present

## 2022-06-28 DIAGNOSIS — U071 COVID-19: Secondary | ICD-10-CM | POA: Diagnosis not present

## 2022-06-28 MED ORDER — AZITHROMYCIN 250 MG PO TABS
ORAL_TABLET | ORAL | 0 refills | Status: DC
Start: 2022-06-28 — End: 2022-08-07

## 2022-06-28 NOTE — Progress Notes (Unsigned)
Virtual Visit via Video Note  I connected with Andres Dillon on 07/01/22 at  3:40 PM EST by a video enabled telemedicine application and verified that I am speaking with the correct person using two identifiers.  Today's Provider: Talitha Dillon, MHS, PA-C Introduced myself to the patient as a PA-C and provided education on APPs in clinical practice.   Location: Patient: at home  Provider: [place], [place]    I discussed the limitations of evaluation and management by telemedicine and the availability of in person appointments. The patient expressed understanding and agreed to proceed.   Chief Complaint  Patient presents with   Cough    Patient says he tested positive for COVID last Thursday and was prescribed medications and an inhaler. Patient says he has completed all his medications and thinks he may need an antibiotic, as he is coughing up "golden brown green phlegm." Patient says he has is trying to get rid of the phlegm.    Congestion     History of Present Illness:  Patient reports he was seen   He was diagnosed with COVID on 06/20/22  Completed Paxlovid and inhaler and cough medication as directed  He is still taking Cough medication from UC, Mucinex and Robitussin  Associated symptoms: reports dark brown and green mucus with cough - he is unsure whether this is from nose or chest  States his nasal congestion is resolving  Reports mild wheezing - states he has been using the rescue inhaler about 2 times per day      Review of Systems  Constitutional:  Negative for chills and fever.  HENT:  Positive for congestion. Negative for sore throat.   Respiratory:  Positive for cough and sputum production. Negative for shortness of breath and wheezing.       Observations/Objective:   Due to the nature of the virtual visit, physical exam and observations are limited. Able to obtain the following observations:   Alert, oriented x3  Appears  comfortable, in no acute distress.  No scleral injection, no appreciated hoarseness, tachypnea, wheeze or strider. Able to maintain conversation without visible strain.  No cough appreciated during visit.     Assessment and Plan:  Problem List Items Addressed This Visit   None Visit Diagnoses     COVID-19    -  Primary   Relevant Medications   azithromycin (ZITHROMAX) 250 MG tablet   Acute maxillary sinusitis, recurrence not specified     Acute, suspect this may be secondary to COVID 19 viral infection  Patient reports persistent dark and blood tinged sputum/ mucus that is not resolving despite completing COVID medications and using OTC symptomatic medications Will send in zpack to assist with potential bacterial sinusitis per patient request Can continue to use OTC medications PRN for symptomatic relief Follow up as needed for persistent or progressing symptoms    Relevant Medications   azithromycin (ZITHROMAX) 250 MG tablet       Follow Up Instructions:    I discussed the assessment and treatment plan with the patient. The patient was provided an opportunity to ask questions and all were answered. The patient agreed with the plan and demonstrated an understanding of the instructions.   The patient was advised to call back or seek an in-person evaluation if the symptoms worsen or if the condition fails to improve as anticipated.  I provided 11 minutes of non-face-to-face time during this encounter.  No follow-ups on file.   I,  Andres Tays E Ludie Hudon, PA-C, have reviewed all documentation for this visit. The documentation on 07/01/22 for the exam, diagnosis, procedures, and orders are all accurate and complete.   Andres Dillon, MHS, PA-C [facility]

## 2022-07-18 ENCOUNTER — Encounter: Payer: BC Managed Care – PPO | Admitting: Family Medicine

## 2022-07-24 ENCOUNTER — Encounter: Payer: BC Managed Care – PPO | Admitting: Family Medicine

## 2022-07-29 DIAGNOSIS — R058 Other specified cough: Secondary | ICD-10-CM | POA: Diagnosis not present

## 2022-07-29 DIAGNOSIS — J40 Bronchitis, not specified as acute or chronic: Secondary | ICD-10-CM | POA: Diagnosis not present

## 2022-07-29 DIAGNOSIS — Z8616 Personal history of COVID-19: Secondary | ICD-10-CM | POA: Diagnosis not present

## 2022-07-29 DIAGNOSIS — R042 Hemoptysis: Secondary | ICD-10-CM | POA: Diagnosis not present

## 2022-07-29 DIAGNOSIS — Z03818 Encounter for observation for suspected exposure to other biological agents ruled out: Secondary | ICD-10-CM | POA: Diagnosis not present

## 2022-07-29 DIAGNOSIS — R059 Cough, unspecified: Secondary | ICD-10-CM | POA: Diagnosis not present

## 2022-08-07 ENCOUNTER — Ambulatory Visit (INDEPENDENT_AMBULATORY_CARE_PROVIDER_SITE_OTHER): Payer: BC Managed Care – PPO | Admitting: Family Medicine

## 2022-08-07 ENCOUNTER — Encounter: Payer: Self-pay | Admitting: Family Medicine

## 2022-08-07 VITALS — BP 109/74 | HR 71 | Temp 98.6°F | Ht 68.0 in | Wt 186.2 lb

## 2022-08-07 DIAGNOSIS — R052 Subacute cough: Secondary | ICD-10-CM

## 2022-08-07 DIAGNOSIS — K2101 Gastro-esophageal reflux disease with esophagitis, with bleeding: Secondary | ICD-10-CM

## 2022-08-07 DIAGNOSIS — R972 Elevated prostate specific antigen [PSA]: Secondary | ICD-10-CM | POA: Diagnosis not present

## 2022-08-07 DIAGNOSIS — Z0000 Encounter for general adult medical examination without abnormal findings: Secondary | ICD-10-CM

## 2022-08-07 DIAGNOSIS — Z136 Encounter for screening for cardiovascular disorders: Secondary | ICD-10-CM | POA: Diagnosis not present

## 2022-08-07 MED ORDER — MONTELUKAST SODIUM 10 MG PO TABS: ORAL_TABLET | ORAL | 3 refills | Status: DC

## 2022-08-07 MED ORDER — OMEPRAZOLE 20 MG PO CPDR: DELAYED_RELEASE_CAPSULE | ORAL | 3 refills | Status: DC

## 2022-08-07 MED ORDER — FLUTICASONE PROPIONATE 50 MCG/ACT NA SUSP: 1.0000 | Freq: Two times a day (BID) | NASAL | 2 refills | Status: DC

## 2022-08-07 NOTE — Assessment & Plan Note (Signed)
Under good control on current regimen. Continue current regimen. Continue to monitor. Call with any concerns. Refills given. Labs drawn today.   

## 2022-08-07 NOTE — Progress Notes (Signed)
BP 109/74 (BP Location: Left Arm, Cuff Size: Normal)   Pulse 71   Temp 98.6 F (37 C) (Oral)   Ht 5' 8"$  (1.727 m)   Wt 186 lb 3.2 oz (84.5 kg)   SpO2 97%   BMI 28.31 kg/m    Subjective:    Patient ID: Andres Dillon, male    DOB: October 20, 1960, 62 y.o.   MRN: [id number]  HPI: Andres Dillon is a 62 y.o. male presenting on 08/07/2022 for comprehensive medical examination. Current medical complaints include:  GERD GERD control status: controlled Satisfied with current treatment? yes Heartburn frequency:  Medication side effects: no  Medication compliance: excellent Previous GERD medications: omeprazole Dysphagia: no Odynophagia:  no Hematemesis: no Blood in stool: no EGD: no  Interim Problems from his last visit: no  Depression Screen done today and results listed below:     08/07/2022    8:32 AM 10/02/2021    2:33 PM 07/17/2021    2:29 PM 08/04/2020    2:03 PM 06/30/2020    1:08 PM  Depression screen PHQ 2/9  Decreased Interest 0 0 1 3 1  $ Down, Depressed, Hopeless 0 1 2 2 1  $ PHQ - 2 Score 0 1 3 5 2  $ Altered sleeping 1 1 1 3 1  $ Tired, decreased energy 1 1 1 2 $ 0  Change in appetite 0 2 1 0 0  Feeling bad or failure about yourself  0 0 0 2 0  Trouble concentrating 0 0 0 2 0  Moving slowly or fidgety/restless 0 0 0 0 0  Suicidal thoughts 0 0 0 0 0  PHQ-9 Score 2 5 6 14 3  $ Difficult doing work/chores Not difficult at all Not difficult at all   Somewhat difficult    Past Medical History:  Past Medical History:  Diagnosis Date   Arthritis    GERD (gastroesophageal reflux disease)     Surgical History:  Past Surgical History:  Procedure Laterality Date   COLONOSCOPY  2014   SPINE SURGERY  12/23/1995   fell/ rods and screws T10 /L2 fusions    Medications:  No current outpatient medications on file prior to visit.   No current facility-administered medications on file prior to visit.    Allergies:  No Known Allergies  Social History:  Social History    Socioeconomic History   Marital status: Married    Spouse name: Not on file   Number of children: Not on file   Years of education: Not on file   Highest education level: Not on file  Occupational History   Not on file  Tobacco Use   Smoking status: Never   Smokeless tobacco: Never  Vaping Use   Vaping Use: Never used  Substance and Sexual Activity   Alcohol use: Yes    Alcohol/week: 12.0 standard drinks of alcohol    Types: 12 Cans of beer per week   Drug use: No   Sexual activity: Yes  Other Topics Concern   Not on file  Social History Narrative   Not on file   Social Determinants of Health   Financial Resource Strain: Not on file  Food Insecurity: Not on file  Transportation Needs: Not on file  Physical Activity: Not on file  Stress: Not on file  Social Connections: Not on file  Intimate Partner Violence: Not on file   Social History   Tobacco Use  Smoking Status Never  Smokeless Tobacco Never   Social History  Substance and Sexual Activity  Alcohol Use Yes   Alcohol/week: 12.0 standard drinks of alcohol   Types: 12 Cans of beer per week    Family History:  Family History  Problem Relation Age of Onset   Cancer Mother    Cancer Father    Heart disease Father 59       CABG    Kidney disease Father     Past medical history, surgical history, medications, allergies, family history and social history reviewed with patient today and changes made to appropriate areas of the chart.   Review of Systems  Constitutional: Negative.   HENT: Negative.         Clearing his throat  Eyes: Negative.   Respiratory:  Positive for cough, shortness of breath and wheezing. Negative for hemoptysis and sputum production.   Cardiovascular: Negative.   Gastrointestinal: Negative.   Genitourinary: Negative.   Musculoskeletal:  Positive for myalgias. Negative for back pain, falls, joint pain and neck pain.  Skin: Negative.   Neurological: Negative.    Endo/Heme/Allergies: Negative.   Psychiatric/Behavioral: Negative.     All other ROS negative except what is listed above and in the HPI.      Objective:    BP 109/74 (BP Location: Left Arm, Cuff Size: Normal)   Pulse 71   Temp 98.6 F (37 C) (Oral)   Ht 5' 8"$  (1.727 m)   Wt 186 lb 3.2 oz (84.5 kg)   SpO2 97%   BMI 28.31 kg/m   Wt Readings from Last 3 Encounters:  08/07/22 186 lb 3.2 oz (84.5 kg)  10/02/21 183 lb 6.4 oz (83.2 kg)  07/17/21 191 lb 12.8 oz (87 kg)    Physical Exam Vitals and nursing note reviewed.  Constitutional:      General: He is not in acute distress.    Appearance: Normal appearance. He is normal weight. He is not ill-appearing, toxic-appearing or diaphoretic.  HENT:     Head: Normocephalic and atraumatic.     Right Ear: Tympanic membrane, ear canal and external ear normal. There is no impacted cerumen.     Left Ear: Tympanic membrane, ear canal and external ear normal. There is no impacted cerumen.     Nose: Nose normal. No congestion or rhinorrhea.     Mouth/Throat:     Mouth: Mucous membranes are moist.     Pharynx: Oropharynx is clear. No oropharyngeal exudate or posterior oropharyngeal erythema.  Eyes:     General: No scleral icterus.       Right eye: No discharge.        Left eye: No discharge.     Extraocular Movements: Extraocular movements intact.     Conjunctiva/sclera: Conjunctivae normal.     Pupils: Pupils are equal, round, and reactive to light.  Neck:     Vascular: No carotid bruit.  Cardiovascular:     Rate and Rhythm: Normal rate and regular rhythm.     Pulses: Normal pulses.     Heart sounds: No murmur heard.    No friction rub. No gallop.  Pulmonary:     Effort: Pulmonary effort is normal. No respiratory distress.     Breath sounds: Normal breath sounds. No stridor. No wheezing, rhonchi or rales.  Chest:     Chest wall: No tenderness.  Abdominal:     General: Abdomen is flat. Bowel sounds are normal. There is no  distension.     Palpations: Abdomen is soft. There is no mass.  Tenderness: There is no abdominal tenderness. There is no right CVA tenderness, left CVA tenderness, guarding or rebound.     Hernia: No hernia is present.  Genitourinary:    Comments: Genital exam deferred with shared decision making Musculoskeletal:        General: No swelling, tenderness, deformity or signs of injury.     Cervical back: Normal range of motion and neck supple. No rigidity. No muscular tenderness.     Right lower leg: No edema.     Left lower leg: No edema.  Lymphadenopathy:     Cervical: No cervical adenopathy.  Skin:    General: Skin is warm and dry.     Capillary Refill: Capillary refill takes less than 2 seconds.     Coloration: Skin is not jaundiced or pale.     Findings: No bruising, erythema, lesion or rash.  Neurological:     General: No focal deficit present.     Mental Status: He is alert and oriented to person, place, and time.     Cranial Nerves: No cranial nerve deficit.     Sensory: No sensory deficit.     Motor: No weakness.     Coordination: Coordination normal.     Gait: Gait normal.     Deep Tendon Reflexes: Reflexes normal.  Psychiatric:        Mood and Affect: Mood normal.        Behavior: Behavior normal.        Thought Content: Thought content normal.        Judgment: Judgment normal.     Results for orders placed or performed in visit on 02/04/22  HM COLONOSCOPY  Result Value Ref Range   HM Colonoscopy See Report (in chart) See Report (in chart), Patient Reported      Assessment & Plan:   Problem List Items Addressed This Visit       Digestive   Gastroesophageal reflux disease with esophagitis    Under good control on current regimen. Continue current regimen. Continue to monitor. Call with any concerns. Refills given. Labs drawn today.       Other Visit Diagnoses     Routine general medical examination at a health care facility    -  Primary   Vaccines  up to date/declined. Screening labs checked today. Colonoscopy up to date. Continue diet and exercise. Call with any concerns.   Relevant Orders   Comprehensive metabolic panel   CBC with Differential/Platelet   Lipid Panel w/o Chol/HDL Ratio   PSA   TSH   Urinalysis, Routine w reflex microscopic   Subacute cough       Lungs clear. Reassured patient. If not getting better or getting worse will go get CXR. Await results.   Relevant Orders   DG Chest 2 View   Elevated PSA       Did not come back for follow up blood work. Will check labs today. Await results.        LABORATORY TESTING:  Health maintenance labs ordered today as discussed above.   The natural history of prostate cancer and ongoing controversy regarding screening and potential treatment outcomes of prostate cancer has been discussed with the patient. The meaning of a false positive PSA and a false negative PSA has been discussed. He indicates understanding of the limitations of this screening test and wishes to proceed with screening PSA testing.   IMMUNIZATIONS:   - Tdap: Tetanus vaccination status reviewed: last tetanus booster within 10  years. - Influenza: Refused - Pneumovax: Not applicable - Prevnar: Not applicable - COVID: Refused - HPV: Not applicable - Shingrix vaccine: Refused  SCREENING: - Colonoscopy: Up to date  Discussed with patient purpose of the colonoscopy is to detect colon cancer at curable precancerous or early stages   PATIENT COUNSELING:    Sexuality: Discussed sexually transmitted diseases, partner selection, use of condoms, avoidance of unintended pregnancy  and contraceptive alternatives.   Advised to avoid cigarette smoking.  I discussed with the patient that most people either abstain from alcohol or drink within safe limits (<=14/week and <=4 drinks/occasion for males, <=7/weeks and <= 3 drinks/occasion for females) and that the risk for alcohol disorders and other health effects rises  proportionally with the number of drinks per week and how often a drinker exceeds daily limits.  Discussed cessation/primary prevention of drug use and availability of treatment for abuse.   Diet: Encouraged to adjust caloric intake to maintain  or achieve ideal body weight, to reduce intake of dietary saturated fat and total fat, to limit sodium intake by avoiding high sodium foods and not adding table salt, and to maintain adequate dietary potassium and calcium preferably from fresh fruits, vegetables, and low-fat dairy products.    stressed the importance of regular exercise  Injury prevention: Discussed safety belts, safety helmets, smoke detector, smoking near bedding or upholstery.   Dental health: Discussed importance of regular tooth brushing, flossing, and dental visits.   Follow up plan: NEXT PREVENTATIVE PHYSICAL DUE IN 1 YEAR. Return in about 1 year (around 08/08/2023) for physical.

## 2022-08-08 ENCOUNTER — Other Ambulatory Visit: Payer: Self-pay | Admitting: Family Medicine

## 2022-08-08 ENCOUNTER — Other Ambulatory Visit: Payer: Self-pay | Admitting: *Deleted

## 2022-08-08 DIAGNOSIS — R972 Elevated prostate specific antigen [PSA]: Secondary | ICD-10-CM

## 2022-08-08 LAB — URINALYSIS, ROUTINE W REFLEX MICROSCOPIC
Bilirubin, UA: NEGATIVE
Glucose, UA: NEGATIVE
Ketones, UA: NEGATIVE
Leukocytes,UA: NEGATIVE
Nitrite, UA: NEGATIVE
Protein,UA: NEGATIVE
RBC, UA: NEGATIVE
Specific Gravity, UA: 1.021 (ref 1.005–1.030)
Urobilinogen, Ur: 0.2 mg/dL (ref 0.2–1.0)
pH, UA: 5.5 (ref 5.0–7.5)

## 2022-08-08 LAB — LIPID PANEL W/O CHOL/HDL RATIO
Cholesterol, Total: 205 mg/dL — ABNORMAL HIGH (ref 100–199)
HDL: 53 mg/dL (ref >39)
LDL Chol Calc (NIH): 128 mg/dL — ABNORMAL HIGH (ref 0–99)
Triglycerides: 134 mg/dL (ref 0–149)
VLDL Cholesterol Cal: 24 mg/dL (ref 5–40)

## 2022-08-08 LAB — COMPREHENSIVE METABOLIC PANEL
ALT: 36 IU/L (ref 0–44)
AST: 20 IU/L (ref 0–40)
Albumin/Globulin Ratio: 2.4 — ABNORMAL HIGH (ref 1.2–2.2)
Albumin: 4.4 g/dL (ref 3.9–4.9)
Alkaline Phosphatase: 57 IU/L (ref 44–121)
BUN/Creatinine Ratio: 21 (ref 10–24)
BUN: 21 mg/dL (ref 8–27)
Bilirubin Total: 0.6 mg/dL (ref 0.0–1.2)
CO2: 26 mmol/L (ref 20–29)
Calcium: 9.6 mg/dL (ref 8.6–10.2)
Chloride: 102 mmol/L (ref 96–106)
Creatinine, Ser: 0.98 mg/dL (ref 0.76–1.27)
Globulin, Total: 1.8 g/dL (ref 1.5–4.5)
Glucose: 97 mg/dL (ref 70–99)
Potassium: 4.5 mmol/L (ref 3.5–5.2)
Sodium: 139 mmol/L (ref 134–144)
Total Protein: 6.2 g/dL (ref 6.0–8.5)
eGFR: 88 mL/min/{1.73_m2} (ref >59)

## 2022-08-08 LAB — CBC WITH DIFFERENTIAL/PLATELET
Basophils Absolute: 0.1 10*3/uL (ref 0.0–0.2)
Basos: 1 %
EOS (ABSOLUTE): 0.4 10*3/uL (ref 0.0–0.4)
Eos: 5 %
Hematocrit: 43.8 % (ref 37.5–51.0)
Hemoglobin: 14.9 g/dL (ref 13.0–17.7)
Immature Grans (Abs): 0.1 10*3/uL (ref 0.0–0.1)
Immature Granulocytes: 1 %
Lymphocytes Absolute: 2.4 10*3/uL (ref 0.7–3.1)
Lymphs: 30 %
MCH: 30.2 pg (ref 26.6–33.0)
MCHC: 34 g/dL (ref 31.5–35.7)
MCV: 89 fL (ref 79–97)
Monocytes Absolute: 0.7 10*3/uL (ref 0.1–0.9)
Monocytes: 9 %
Neutrophils Absolute: 4.4 10*3/uL (ref 1.4–7.0)
Neutrophils: 54 %
Platelets: 305 10*3/uL (ref 150–450)
RBC: 4.93 x10E6/uL (ref 4.14–5.80)
RDW: 13.8 % (ref 11.6–15.4)
WBC: 8 10*3/uL (ref 3.4–10.8)

## 2022-08-08 LAB — PSA: Prostate Specific Ag, Serum: 4.6 ng/mL — ABNORMAL HIGH (ref 0.0–4.0)

## 2022-08-08 LAB — TSH: TSH: 0.946 u[IU]/mL (ref 0.450–4.500)

## 2022-08-08 MED ORDER — BENZONATATE 200 MG PO CAPS: 200.0000 mg | ORAL_CAPSULE | Freq: Two times a day (BID) | ORAL | 0 refills | Status: DC | PRN

## 2022-08-08 NOTE — Telephone Encounter (Signed)
Dc'd 07/17/21 Dr. Wynetta Emery   Requested Prescriptions  Refused Prescriptions Disp Refills   sildenafil (REVATIO) 20 MG tablet 50 tablet 12    Sig: Take 1-5 tablets (20-100 mg total) by mouth as needed.     Urology: Erectile Dysfunction Agents Passed - 08/08/2022 10:17 AM      Passed - AST in normal range and within 360 days    AST  Date Value Ref Range Status  08/07/2022 20 0 - 40 IU/L Final         Passed - ALT in normal range and within 360 days    ALT  Date Value Ref Range Status  08/07/2022 36 0 - 44 IU/L Final         Passed - Last BP in normal range    BP Readings from Last 1 Encounters:  08/07/22 109/74         Passed - Valid encounter within last 12 months    Recent Outpatient Visits           Yesterday Routine general medical examination at a health care facility   [place], Rives, DO   1 month ago [place], PA-C   10 months ago Grade I hemorrhoids   [place] Kathrine Haddock, NP   1 year ago Routine general medical examination at a health care facility   [place], [place], DO   2 years ago S/P lumbar fusion   [place] Venita Lick, NP       Future Appointments             In 1 year [place], Barb Merino, DO [place], [facility]

## 2022-08-08 NOTE — Telephone Encounter (Signed)
Medication Refill - Medication: sildenafil (REVATIO) 20 MG tablet   Pt stated he had an appointment with Dr.Johnson yesterday and forgot to ask her to fill this medication.  Please advise.   Has the patient contacted their pharmacy? No. (Agent: If no, request that the patient contact the pharmacy for the refill. If patient does not wish to contact the pharmacy document the reason why and proceed with request.)   Preferred Pharmacy (with phone number or street name):  [id number] [place]  [address] Phone: [phone]: [phone] Hours: Not open 24 hours   Has the patient been seen for an appointment in the last year OR does the patient have an upcoming appointment? Yes.    Agent: Please be advised that RX refills may take up to 3 business days. We ask that you follow-up with your pharmacy.

## 2022-08-13 ENCOUNTER — Telehealth: Payer: Self-pay

## 2022-08-13 NOTE — Telephone Encounter (Signed)
Pt called regarding PSA lab test. Shared provider's note.  Patient Communication   Edit Comments   Edit Notifications  Back to Top   Everything looks nice and normal. Your PSA is stable, but still a little elevated. I'm going to get you into see the urologist just to make sure it's OK. Have a great day!  Written by Valerie Roys, DO on 08/08/2022  8:42 AM EST  PT will schedule an appt with urologist for followup.

## 2022-08-29 DIAGNOSIS — R053 Chronic cough: Secondary | ICD-10-CM | POA: Diagnosis not present

## 2022-08-29 DIAGNOSIS — R059 Cough, unspecified: Secondary | ICD-10-CM | POA: Diagnosis not present

## 2022-08-29 DIAGNOSIS — R058 Other specified cough: Secondary | ICD-10-CM | POA: Diagnosis not present

## 2022-08-29 DIAGNOSIS — J449 Chronic obstructive pulmonary disease, unspecified: Secondary | ICD-10-CM | POA: Diagnosis not present

## 2022-08-29 DIAGNOSIS — R0602 Shortness of breath: Secondary | ICD-10-CM | POA: Diagnosis not present

## 2022-09-04 ENCOUNTER — Ambulatory Visit: Payer: BC Managed Care – PPO | Admitting: Urology

## 2022-09-04 VITALS — BP 118/75 | HR 108 | Ht 69.0 in | Wt 177.4 lb

## 2022-09-04 DIAGNOSIS — N401 Enlarged prostate with lower urinary tract symptoms: Secondary | ICD-10-CM | POA: Diagnosis not present

## 2022-09-04 DIAGNOSIS — N529 Male erectile dysfunction, unspecified: Secondary | ICD-10-CM | POA: Diagnosis not present

## 2022-09-04 DIAGNOSIS — R3912 Poor urinary stream: Secondary | ICD-10-CM

## 2022-09-04 DIAGNOSIS — R972 Elevated prostate specific antigen [PSA]: Secondary | ICD-10-CM

## 2022-09-04 MED ORDER — SILDENAFIL CITRATE 100 MG PO TABS: 100.0000 mg | ORAL_TABLET | Freq: Every day | ORAL | 11 refills | Status: DC | PRN

## 2022-09-04 MED ORDER — TAMSULOSIN HCL 0.4 MG PO CAPS: 0.4000 mg | ORAL_CAPSULE | Freq: Every day | ORAL | 6 refills | Status: DC

## 2022-09-04 NOTE — Progress Notes (Signed)
Haze Rushing Plume,acting as a scribe for Hollice Espy, MD.,have documented all relevant documentation on the behalf of Hollice Espy, MD,as directed by  Hollice Espy, MD while in the presence of Hollice Espy, MD.  09/04/2022 4:34 PM   Andres Dillon 08-Feb-1961 [id number]  Referring provider: Valerie Roys, DO [place],  [place] [address]  Chief Complaint  Patient presents with   Establish Care   Elevated PSA    HPI: 62 year-old male with a personal history of elevated rising PSA who presents today for further evaluation of this.   His most recent PSA on 08/07/2022 was 4.6 which is down slightly from the previous year at 4.8. He had a normal urinalysis at the time of his most recent PSA.  Today, he notes occasional nocturia, a weaker stream, and the need to strain. He does feel as though he is able to empty his bladder completely.   He reports erectile dysfunction. He has difficulty maintaining an erection. He was diagnosed several years ago with low testosterone. He has tried Viagra in the past. He would like to discuss treatments options today.   He notes that his father had prostate cancer in his early 92s but does not recall if it metastasized or if he ever needed treatment. He denies any family history of breast or ovarian cancer.     Component     Latest Ref Rng 01/01/2018 05/27/2019 06/30/2020 07/17/2021 08/07/2022  Prostate Specific Ag, Serum     0.0 - 4.0 ng/mL 2.8  2.6  3.1  4.8 (H)  4.6 (H)     Legend: (H) High  PMH: Past Medical History:  Diagnosis Date   Arthritis    GERD (gastroesophageal reflux disease)     Surgical History: Past Surgical History:  Procedure Laterality Date   COLONOSCOPY  2014   SPINE SURGERY  12/23/1995   fell/ rods and screws T10 /L2 fusions    Home Medications:  Allergies as of 09/04/2022   No Known Allergies      Medication List        Accurate as of September 04, 2022  4:34 PM. If you have any questions, ask your  nurse or doctor.          STOP taking these medications    benzonatate 200 MG capsule Commonly known as: TESSALON       TAKE these medications    fluticasone 50 MCG/ACT nasal spray Commonly known as: FLONASE Place 1 spray into both nostrils 2 (two) times daily.   montelukast 10 MG tablet Commonly known as: SINGULAIR TAKE 1 TABLET BY MOUTH EVERYDAY AT BEDTIME   omeprazole 20 MG capsule Commonly known as: PRILOSEC TAKE 1 CAPSULE BY MOUTH EVERY DAY   predniSONE 10 MG tablet Commonly known as: DELTASONE Take 10 mg by mouth daily with breakfast.   sildenafil 100 MG tablet Commonly known as: VIAGRA Take 1 tablet (100 mg total) by mouth daily as needed for erectile dysfunction.   tamsulosin 0.4 MG Caps capsule Commonly known as: FLOMAX Take 1 capsule (0.4 mg total) by mouth daily.          Family History: Family History  Problem Relation Age of Onset   Cancer Mother    Cancer Father    Heart disease Father 32       CABG    Kidney disease Father     Social History:  reports that he has never smoked. He has never used smokeless  tobacco. He reports current alcohol use of about 12.0 standard drinks of alcohol per week. He reports that he does not use drugs.   Physical Exam: BP 118/75   Pulse (!) 108   Ht '5\' 9"'$  (1.753 m)   Wt 177 lb 6 oz (80.5 kg)   BMI 26.19 kg/m   Constitutional:  Alert and oriented, No acute distress. HEENT: Ina AT, moist mucus membranes.  Trachea midline, no masses. GU: 50-60 gram prostate. Rubbery lateral lobes. No nodularity.  Neurologic: Grossly intact, no focal deficits, moving all 4 extremities. Psychiatric: Normal mood and affect.  Assessment & Plan:    1. BPH with weak urinary stream - We discussed the pathology of this and he is interested in trying Flomax. - We deferred discussion of procedures today.   2. Elevated PSA -  We reviewed the implications of an elevated PSA and the uncertainty surrounding it. In general, a  man's PSA increases with age and is produced by both normal and cancerous prostate tissue. Differential for elevated PSA is BPH, prostate cancer, infection, recent intercourse/ejaculation, prostate infarction, recent urethroscopic manipulation (foley placement/cystoscopy) and prostatitis. Management of an elevated PSA can include observation or prostate biopsy and we discussed this in detail. We discussed that indications for prostate biopsy are defined by age and race specific PSA cutoffs as well as a PSA velocity of 0.75/year. - We discussed prostate biopsy in detail including the procedure itself, the risks of blood in the urine, stool, and ejaculate, serious infection, and discomfort.  - His rectal exam was reassuring today. - PSA remains stable. PSA in 6 months. - We discussed biopsy versus MRI. We will defer these and just trend for the time being.  - Have fairly low suspicion, may be more related to enlarged prostate.  3. Erectile dysfunction - Sildenafil 100 mg, take 1 hour prior to intercourse. Most effective on an empty stomach.  - We discussed the pathophysiology of erectile dysfunction today along with possible contributing factors. Discussed possible treatment options including PDE 5 inhibitors, vacuum erectile device, intracavernosal injection, MUSE, and placement of the inflatable or malleable penile prosthesis for refractory cases.  In terms of PDE 5 inhibitors, we discussed contraindications for this medication as well as common side effects. Patient was counseled on optimal use. All of his questions were answered in detail.   Return in about 6 months (around 03/07/2023) for PSA, IPSS, and PVR.   [place] [address], [place], [place] [address] [phone]

## 2022-09-23 DIAGNOSIS — J301 Allergic rhinitis due to pollen: Secondary | ICD-10-CM | POA: Diagnosis not present

## 2022-09-23 DIAGNOSIS — K219 Gastro-esophageal reflux disease without esophagitis: Secondary | ICD-10-CM | POA: Diagnosis not present

## 2022-09-23 DIAGNOSIS — J4520 Mild intermittent asthma, uncomplicated: Secondary | ICD-10-CM | POA: Diagnosis not present

## 2022-09-26 ENCOUNTER — Telehealth: Payer: Self-pay | Admitting: Family Medicine

## 2022-09-26 NOTE — Telephone Encounter (Signed)
Copied from [place] [phone]. Topic: General - Other >> Sep 26, 2022 12:28 PM Everette C wrote: Reason for CRM: The patient would like to be contacted by a member of staff regarding imaging  The patient would like to request orders for an ultrasound   Please contact the patient further when possible

## 2022-09-27 NOTE — Telephone Encounter (Signed)
Spoke with patient to gather more information regarding his telephone encounter. Patient says he is having some pain in his neck. Patient says he has pain that comes and goes. Patient says he did have trauma to the area years ago. Patient says he will play by ear over the next week and if the pain and discomfort has not subsided by then. He would call our office to get an appointment, as he was advised provider would want to see him before ordering any imaging. Patient verbalized understanding.

## 2022-10-28 ENCOUNTER — Other Ambulatory Visit: Payer: Self-pay | Admitting: Family Medicine

## 2022-10-28 ENCOUNTER — Other Ambulatory Visit: Payer: Self-pay | Admitting: Nurse Practitioner

## 2022-10-28 DIAGNOSIS — M6740 Ganglion, unspecified site: Secondary | ICD-10-CM | POA: Diagnosis not present

## 2022-10-28 DIAGNOSIS — M79672 Pain in left foot: Secondary | ICD-10-CM | POA: Diagnosis not present

## 2022-10-28 DIAGNOSIS — M898X9 Other specified disorders of bone, unspecified site: Secondary | ICD-10-CM | POA: Diagnosis not present

## 2022-10-28 DIAGNOSIS — M19072 Primary osteoarthritis, left ankle and foot: Secondary | ICD-10-CM | POA: Diagnosis not present

## 2022-10-29 ENCOUNTER — Other Ambulatory Visit: Payer: Self-pay | Admitting: Family Medicine

## 2022-10-29 NOTE — Telephone Encounter (Signed)
Unable to refill per protocol, Rx request is too soon. Last refill 08/07/22 for 90 and 3 refills.  Requested Prescriptions  Pending Prescriptions Disp Refills   omeprazole (PRILOSEC) 20 MG capsule [Pharmacy Med Name: OMEPRAZOLE DR 20 MG CAPSULE] 90 capsule 3    Sig: TAKE 1 CAPSULE BY MOUTH EVERY DAY     Gastroenterology: Proton Pump Inhibitors Passed - 10/29/2022 12:52 PM      Passed - Valid encounter within last 12 months    Recent Outpatient Visits           2 months ago Routine general medical examination at a health care facility   [facility] [place], Megan P, DO   4 months ago COVID-19   [place] [facility] Mecum, Oswaldo Conroy, PA-C   1 year ago Grade I hemorrhoids   [place] [facility] Gabriel Cirri, NP   1 year ago Routine general medical examination at a health care facility   [facility] [place], Megan P, DO   2 years ago S/P lumbar fusion   [place] [facility] Marjie Skiff, NP       Future Appointments             In 4 months [name], MD [facility] [place]   In 9 months Laural Benes, Oralia Rud, DO [place] [facility], [facility]

## 2022-10-29 NOTE — Telephone Encounter (Signed)
Requested Prescriptions  Pending Prescriptions Disp Refills   omeprazole (PRILOSEC) 20 MG capsule [Pharmacy Med Name: OMEPRAZOLE DR 20 MG CAPSULE] 90 capsule 3    Sig: TAKE 1 CAPSULE BY MOUTH EVERY DAY     Gastroenterology: Proton Pump Inhibitors Passed - 10/28/2022  3:55 PM      Passed - Valid encounter within last 12 months    Recent Outpatient Visits           2 months ago Routine general medical examination at a health care facility   [facility] [place], Megan P, DO   4 months ago COVID-19   [place] [facility] Mecum, Oswaldo Conroy, PA-C   1 year ago Grade I hemorrhoids   [place] [facility] Gabriel Cirri, NP   1 year ago Routine general medical examination at a health care facility   [place] [facility] [place], Megan P, DO   2 years ago S/P lumbar fusion   [place] [facility] Marjie Skiff, NP       Future Appointments             In 4 months [name], MD [facility] [place]   In 9 months Laural Benes, Oralia Rud, DO [place] [facility], [facility]

## 2022-10-29 NOTE — Telephone Encounter (Signed)
Refilled 08/07/22 #90 with 3 refills. Requested Prescriptions  Refused Prescriptions Disp Refills   omeprazole (PRILOSEC) 40 MG capsule [Pharmacy Med Name: OMEPRAZOLE DR 40 MG CAPSULE] 90 capsule 1    Sig: TAKE 1 CAPSULE BY MOUTH EVERY DAY     Gastroenterology: Proton Pump Inhibitors Passed - 10/28/2022  3:52 PM      Passed - Valid encounter within last 12 months    Recent Outpatient Visits           2 months ago Routine general medical examination at a health care facility   [facility] [place], Megan P, DO   4 months ago COVID-19   [place] [facility] Mecum, Oswaldo Conroy, PA-C   1 year ago Grade I hemorrhoids   [place] [facility] Gabriel Cirri, NP   1 year ago Routine general medical examination at a health care facility   [facility], Megan P, DO   2 years ago S/P lumbar fusion   [place] [facility] Marjie Skiff, NP       Future Appointments             In 4 months [name], MD [facility] [place]   In 9 months Laural Benes, Oralia Rud, DO  [facility], [facility]

## 2022-10-31 ENCOUNTER — Other Ambulatory Visit: Payer: Self-pay | Admitting: Family Medicine

## 2022-10-31 NOTE — Telephone Encounter (Signed)
[facility] Loraine Leriche who stated pt's insurance will only pay for #90 pills every year and said this drug will need a prior authorization.

## 2022-10-31 NOTE — Telephone Encounter (Signed)
Please let him know insurance will not pay for this medicine. He should get it OTC

## 2022-11-04 NOTE — Telephone Encounter (Signed)
Spoke with patient to make him aware of Dr Henriette Combs recommendations. Patient says he has been having issues with neck pain and is requesting an ultrasound. Patient is scheduled for an appointment with Prescott Gum, NP on 11/11/22 at 10:00 AM. Patient verbalized understanding and no further questions.

## 2022-11-11 ENCOUNTER — Ambulatory Visit: Payer: BC Managed Care – PPO | Admitting: Family Medicine

## 2022-11-11 VITALS — BP 128/90 | HR 74 | Temp 98.2°F | Ht 69.69 in | Wt 184.4 lb

## 2022-11-11 DIAGNOSIS — M542 Cervicalgia: Secondary | ICD-10-CM

## 2022-11-11 DIAGNOSIS — R42 Dizziness and giddiness: Secondary | ICD-10-CM | POA: Diagnosis not present

## 2022-11-11 MED ORDER — CYCLOBENZAPRINE HCL 10 MG PO TABS
10.0000 mg | ORAL_TABLET | Freq: Three times a day (TID) | ORAL | 0 refills | Status: DC | PRN
Start: 2022-11-11 — End: 2023-08-11

## 2022-11-11 NOTE — Assessment & Plan Note (Addendum)
Acute, ongoing. Recommend Flexxerill x3/day PRN, heat/massage therapy, and neck exercises for pain. F/u in 2 weeks. If pain unresolved will consider adding NSAID or refer to ortho for further management.

## 2022-11-11 NOTE — Progress Notes (Signed)
BP (!) 128/90 (BP Location: Left Arm, Patient Position: Supine)   Pulse 74   Temp 98.2 F (36.8 C) (Oral)   Ht 5' 9.69" (1.77 m)   Wt 184 lb 6.4 oz (83.6 kg)   SpO2 96%   BMI 26.70 kg/m    Subjective:    Patient ID: Andres Dillon, male    DOB: 22-Apr-1961, 62 y.o.   MRN: [id number]  HPI: Andres Dillon is a 62 y.o. male  Chief Complaint  Patient presents with   Neck Pain    Worsened after laying on it for a while, noticed dizzy spells.    Dizziness    DIZZINESS Duration:2-3 weeks ago. Sometimes associated with standing up quickly and sitting. He feels lightheaded at times but no episodes of syncope. Orthostatic vitals are negative today.  Sitting BP: 123/87 Standing BP: 127/85 Lying BP: 128/90  Description of symptoms: not room spinning, feels like he is spinning Duration of episode: 15 seconds  Dizziness frequency:  Randomly Provoking factors: none Aggravating factors:  none Triggered by rolling over in bed: no Triggered by bending over: no Aggravated by head movement: no Aggravated by exertion, coughing, loud noises: no Recent head injury: no Recent or current viral symptoms: no History of vasovagal episodes: no Nausea: no Vomiting: no Tinnitus: no Hearing loss: no Headache: no Photophobia/phonophobia: no Unsteady gait: no Postural instability: no Diplopia, dysarthria, dysphagia or weakness: No. Sometimes has dysphagia caused by acid reflux, is currently using omeprazole for this as needed.  Related to exertion: yes Pallor: no Diaphoresis: no Dyspnea: no Chest pain: no   NECK PAIN  He has a history of falling in 1997 where he fell on his neck and back. 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 He believes after this he started having a history of neck pain. Left side is worse than right.  Status: stable Treatments attempted: none  Relief with NSAIDs?:  No NSAIDs Taken Location:L>R Duration:3-4 months Severity:  Varies worsened at  night Quality: dull and aching Frequency: constant Radiation: none Aggravating factors:  Movement, and lying down at night Alleviating factors: nothing Weakness:  no Paresthesias / decreased sensation:  no  Fevers:  no  Relevant past medical, surgical, family and social history reviewed and updated as indicated. Interim medical history since our last visit reviewed. Allergies and medications reviewed and updated.     11/11/2022   11:34 AM 08/07/2022    8:32 AM 10/02/2021    2:33 PM  PHQ9 SCORE ONLY  PHQ-9 Total Score 7 2 5        11/11/2022   11:35 AM 08/07/2022    8:32 AM 10/02/2021    2:33 PM 07/17/2021    2:30 PM  GAD 7 : Generalized Anxiety Score  Nervous, Anxious, on Edge 1 0 1 1  Control/stop worrying 1 0 2 1  Worry too much - different things 1 0 2 1  Trouble relaxing 2 0 1 1  Restless 1 0 0 0  Easily annoyed or irritable 2 0 0 1  Afraid - awful might happen 1 0 0 0  Total GAD 7 Score 9 0 6 5  Anxiety Difficulty Somewhat difficult  Not difficult at all Somewhat difficult      Review of Systems  Constitutional:  Negative for fever.  HENT:  Positive for trouble swallowing. Negative for tinnitus.   Eyes:  Negative for photophobia.  Respiratory: Negative.    Cardiovascular: Negative.   Gastrointestinal:  Negative for  nausea and vomiting.  Musculoskeletal:  Positive for neck pain. Negative for back pain, gait problem and neck stiffness.  Skin:  Negative for pallor.  Neurological:  Positive for dizziness and light-headedness. Negative for syncope, weakness, numbness and headaches.    Per HPI unless specifically indicated above     Objective:    BP (!) 128/90 (BP Location: Left Arm, Patient Position: Supine)   Pulse 74   Temp 98.2 F (36.8 C) (Oral)   Ht 5' 9.69" (1.77 m)   Wt 184 lb 6.4 oz (83.6 kg)   SpO2 96%   BMI 26.70 kg/m   Wt Readings from Last 3 Encounters:  11/11/22 184 lb 6.4 oz (83.6 kg)  09/04/22 177 lb 6 oz (80.5 kg)  08/07/22 186 lb 3.2 oz  (84.5 kg)    Physical Exam Vitals and nursing note reviewed.  Constitutional:      General: He is awake. He is not in acute distress.    Appearance: Normal appearance. He is well-developed, well-groomed and overweight. He is not ill-appearing.  HENT:     Head: Normocephalic and atraumatic.     Right Ear: Hearing and external ear normal. No drainage.     Left Ear: Hearing and external ear normal. No drainage.     Nose: Nose normal.  Eyes:     General: Lids are normal.        Right eye: No discharge.        Left eye: No discharge.     Extraocular Movements: Extraocular movements intact.     Conjunctiva/sclera: Conjunctivae normal.  Neck:     Thyroid: No thyroid mass or thyroid tenderness.     Vascular: No carotid bruit.     Trachea: No tracheal tenderness.  Cardiovascular:     Rate and Rhythm: Normal rate and regular rhythm.     Pulses: Normal pulses.          Carotid pulses are 2+ on the right side and 2+ on the left side.    Heart sounds: Normal heart sounds, S1 normal and S2 normal. No murmur heard.    No gallop.  Pulmonary:     Effort: Pulmonary effort is normal. No accessory muscle usage or respiratory distress.     Breath sounds: Normal breath sounds.  Abdominal:     General: Bowel sounds are normal.  Musculoskeletal:        General: Normal range of motion.     Cervical back: Normal range of motion. No torticollis. Pain with movement and muscular tenderness present. Normal range of motion.     Right lower leg: No edema.     Left lower leg: No edema.     Comments: Arthritis in posterior neck  Skin:    General: Skin is warm and dry.     Capillary Refill: Capillary refill takes less than 2 seconds.  Neurological:     Mental Status: He is alert and oriented to person, place, and time.     Cranial Nerves: No cranial nerve deficit, dysarthria or facial asymmetry.     Sensory: Sensation is intact.     Motor: Motor function is intact. No weakness or tremor.      Coordination: Coordination is intact.     Gait: Gait is intact.  Psychiatric:        Attention and Perception: Attention normal.        Mood and Affect: Mood normal.        Speech: Speech normal.  Behavior: Behavior normal. Behavior is cooperative.        Thought Content: Thought content normal.    Results for orders placed or performed in visit on 08/07/22  Comprehensive metabolic panel  Result Value Ref Range   Glucose 97 70 - 99 mg/dL   BUN 21 8 - 27 mg/dL   Creatinine, Ser 9.60 0.76 - 1.27 mg/dL   eGFR 88 >45 WU/JWJ/1.91   BUN/Creatinine Ratio 21 10 - 24   Sodium 139 134 - 144 mmol/L   Potassium 4.5 3.5 - 5.2 mmol/L   Chloride 102 96 - 106 mmol/L   CO2 26 20 - 29 mmol/L   Calcium 9.6 8.6 - 10.2 mg/dL   Total Protein 6.2 6.0 - 8.5 g/dL   Albumin 4.4 3.9 - 4.9 g/dL   Globulin, Total 1.8 1.5 - 4.5 g/dL   Albumin/Globulin Ratio 2.4 (H) 1.2 - 2.2   Bilirubin Total 0.6 0.0 - 1.2 mg/dL   Alkaline Phosphatase 57 44 - 121 IU/L   AST 20 0 - 40 IU/L   ALT 36 0 - 44 IU/L  CBC with Differential/Platelet  Result Value Ref Range   WBC 8.0 3.4 - 10.8 x10E3/uL   RBC 4.93 4.14 - 5.80 x10E6/uL   Hemoglobin 14.9 13.0 - 17.7 g/dL   Hematocrit 47.8 29.5 - 51.0 %   MCV 89 79 - 97 fL   MCH 30.2 26.6 - 33.0 pg   MCHC 34.0 31.5 - 35.7 g/dL   RDW 62.1 30.8 - 65.7 %   Platelets 305 150 - 450 x10E3/uL   Neutrophils 54 Not Estab. %   Lymphs 30 Not Estab. %   Monocytes 9 Not Estab. %   Eos 5 Not Estab. %   Basos 1 Not Estab. %   Neutrophils Absolute 4.4 1.4 - 7.0 x10E3/uL   Lymphocytes Absolute 2.4 0.7 - 3.1 x10E3/uL   Monocytes Absolute 0.7 0.1 - 0.9 x10E3/uL   EOS (ABSOLUTE) 0.4 0.0 - 0.4 x10E3/uL   Basophils Absolute 0.1 0.0 - 0.2 x10E3/uL   Immature Granulocytes 1 Not Estab. %   Immature Grans (Abs) 0.1 0.0 - 0.1 x10E3/uL  Lipid Panel w/o Chol/HDL Ratio  Result Value Ref Range   Cholesterol, Total 205 (H) 100 - 199 mg/dL   Triglycerides 846 0 - 149 mg/dL   HDL 53 >96 mg/dL    VLDL Cholesterol Cal 24 5 - 40 mg/dL   LDL Chol Calc (NIH) 295 (H) 0 - 99 mg/dL  PSA  Result Value Ref Range   Prostate Specific Ag, Serum 4.6 (H) 0.0 - 4.0 ng/mL  TSH  Result Value Ref Range   TSH 0.946 0.450 - 4.500 uIU/mL  Urinalysis, Routine w reflex microscopic  Result Value Ref Range   Specific Gravity, UA 1.021 1.005 - 1.030   pH, UA 5.5 5.0 - 7.5   Color, UA Yellow Yellow   Appearance Ur Clear Clear   Leukocytes,UA Negative Negative   Protein,UA Negative Negative/Trace   Glucose, UA Negative Negative   Ketones, UA Negative Negative   RBC, UA Negative Negative   Bilirubin, UA Negative Negative   Urobilinogen, Ur 0.2 0.2 - 1.0 mg/dL   Nitrite, UA Negative Negative   Microscopic Examination Comment       Assessment & Plan:   Problem List Items Addressed This Visit     Neck pain - Primary    Acute, ongoing. Recommend Flexxerill x3/day PRN, heat/massage therapy, and neck exercises for pain. F/u in 2 weeks. If pain  unresolved will consider adding NSAID or refer to ortho for further management.       Relevant Medications   cyclobenzaprine (FLEXERIL) 10 MG tablet   Other Visit Diagnoses     Dizziness, nonspecific       Acute, stable. CBC, CMP, TSH, and carotid US ordered today. Recommend changing positions from sitting to standing slowly. Will treat based on lab results.   Relevant Orders   CBC With Diff/Platelet   Comp Met (CMET)   TSH   US Carotid Duplex Bilateral       Follow up plan: Return in about 2 weeks (around 11/25/2022) for neck pain and mood.

## 2022-11-11 NOTE — Patient Instructions (Signed)
Omeprazole is available over the counter Try neck exercises for pain along with muscle relaxer Wait for call to be scheduled for ultrasound

## 2022-11-12 LAB — COMPREHENSIVE METABOLIC PANEL
ALT: 25 IU/L (ref 0–44)
AST: 22 IU/L (ref 0–40)
Albumin/Globulin Ratio: 2.4 — ABNORMAL HIGH (ref 1.2–2.2)
Albumin: 4.6 g/dL (ref 3.9–4.9)
Alkaline Phosphatase: 69 IU/L (ref 44–121)
BUN/Creatinine Ratio: 21 (ref 10–24)
BUN: 20 mg/dL (ref 8–27)
Bilirubin Total: 0.5 mg/dL (ref 0.0–1.2)
CO2: 24 mmol/L (ref 20–29)
Calcium: 9.9 mg/dL (ref 8.6–10.2)
Chloride: 104 mmol/L (ref 96–106)
Creatinine, Ser: 0.97 mg/dL (ref 0.76–1.27)
Globulin, Total: 1.9 g/dL (ref 1.5–4.5)
Glucose: 90 mg/dL (ref 70–99)
Potassium: 4.7 mmol/L (ref 3.5–5.2)
Sodium: 142 mmol/L (ref 134–144)
Total Protein: 6.5 g/dL (ref 6.0–8.5)
eGFR: 89 mL/min/{1.73_m2} (ref >59)

## 2022-11-12 LAB — TSH: TSH: 0.713 u[IU]/mL (ref 0.450–4.500)

## 2022-11-14 NOTE — Addendum Note (Signed)
Addended by: Prescott Gum on: 11/14/2022 11:37 AM   Modules accepted: Orders

## 2022-11-19 ENCOUNTER — Other Ambulatory Visit: Payer: BC Managed Care – PPO

## 2022-11-19 DIAGNOSIS — R42 Dizziness and giddiness: Secondary | ICD-10-CM | POA: Diagnosis not present

## 2022-11-20 ENCOUNTER — Telehealth: Payer: Self-pay

## 2022-11-20 LAB — CBC WITH DIFF/PLATELET
Basophils Absolute: 0.1 10*3/uL (ref 0.0–0.2)
Basos: 1 %
EOS (ABSOLUTE): 0.3 10*3/uL (ref 0.0–0.4)
Eos: 5 %
Hematocrit: 44.6 % (ref 37.5–51.0)
Hemoglobin: 14.9 g/dL (ref 13.0–17.7)
Immature Grans (Abs): 0.1 10*3/uL (ref 0.0–0.1)
Immature Granulocytes: 1 %
Lymphocytes Absolute: 1.5 10*3/uL (ref 0.7–3.1)
Lymphs: 23 %
MCH: 30.4 pg (ref 26.6–33.0)
MCHC: 33.4 g/dL (ref 31.5–35.7)
MCV: 91 fL (ref 79–97)
Monocytes Absolute: 0.6 10*3/uL (ref 0.1–0.9)
Monocytes: 9 %
Neutrophils Absolute: 4.1 10*3/uL (ref 1.4–7.0)
Neutrophils: 61 %
Platelets: 228 10*3/uL (ref 150–450)
RBC: 4.9 x10E6/uL (ref 4.14–5.80)
RDW: 13.8 % (ref 11.6–15.4)
WBC: 6.7 10*3/uL (ref 3.4–10.8)

## 2022-11-20 NOTE — Telephone Encounter (Signed)
Unfortunately there is no way for [place] to know this. It will depend on how quickly the radiologist reads it. Usually I have them back in that time frame, but I cannot guarantee that.

## 2022-11-20 NOTE — Telephone Encounter (Signed)
Copied from CRM [id number]. Topic: General - Other >> Nov 20, 2022  4:00 PM Carrielelia G wrote:  Andres Dillon would like a call back, regarding his upcoming  friday [facility]. Will dr Laural Benes have the results by his appointment time on Monday. If not he would like to push his appointment back.

## 2022-11-21 NOTE — Telephone Encounter (Signed)
Patient notified and verbalized understanding. Patient was advised to keep his scheduled appointment for June 3rd at 8:40 am. Patient verbalized understanding.

## 2022-11-22 ENCOUNTER — Ambulatory Visit
Admission: RE | Admit: 2022-11-22 | Discharge: 2022-11-22 | Disposition: A | Discharge status: Home / Self Care | Payer: BC Managed Care – PPO | Source: Ambulatory Visit | Attending: Family Medicine

## 2022-11-22 DIAGNOSIS — I6523 Occlusion and stenosis of bilateral carotid arteries: Secondary | ICD-10-CM | POA: Diagnosis not present

## 2022-11-22 DIAGNOSIS — R42 Dizziness and giddiness: Secondary | ICD-10-CM | POA: Insufficient documentation

## 2022-11-25 ENCOUNTER — Ambulatory Visit
Admission: RE | Admit: 2022-11-25 | Discharge: 2022-11-25 | Disposition: A | Discharge status: Home / Self Care | Payer: BC Managed Care – PPO | Source: Ambulatory Visit | Attending: Family Medicine | Admitting: Family Medicine

## 2022-11-25 ENCOUNTER — Encounter: Payer: Self-pay | Admitting: Family Medicine

## 2022-11-25 ENCOUNTER — Ambulatory Visit
Admission: RE | Admit: 2022-11-25 | Discharge: 2022-11-25 | Disposition: A | Discharge status: Home / Self Care | Payer: BC Managed Care – PPO | Attending: Family Medicine | Admitting: Family Medicine

## 2022-11-25 ENCOUNTER — Ambulatory Visit: Payer: BC Managed Care – PPO | Admitting: Family Medicine

## 2022-11-25 VITALS — BP 110/73 | HR 87 | Temp 98.5°F | Ht 69.0 in | Wt 186.8 lb

## 2022-11-25 DIAGNOSIS — M542 Cervicalgia: Secondary | ICD-10-CM | POA: Diagnosis not present

## 2022-11-25 MED ORDER — MELOXICAM 15 MG PO TABS
15.0000 mg | ORAL_TABLET | Freq: Every day | ORAL | 0 refills | Status: DC
Start: 2022-11-25 — End: 2022-12-24

## 2022-11-25 NOTE — Progress Notes (Signed)
BP 110/73   Pulse 87   Temp 98.5 F (36.9 C) (Oral)   Ht 5\' 9"  (1.753 m)   Wt 186 lb 12.8 oz (84.7 kg)   SpO2 98%   BMI 27.59 kg/m    Subjective:    Patient ID: Andres Dillon, male    DOB: [birth year], 62 y.o.   MRN: [id number]  HPI: Andres Dillon is a 62 y.o. male  Chief Complaint  Patient presents with   Neck Pain   NECK PAIN  Status: better Treatments attempted: flexeril- helped  Compliant with recommended treatment: yes Relief with NSAIDs?:  No NSAIDs Taken Location: L posterior and into the bone in the back of his neck Duration:4+ months Severity: more aggravating Quality: tight Frequency: constant, waxes and wanes Radiation: none Aggravating factors: working, bending his head back Alleviating factors: flexeril Weakness:  no Paresthesias / decreased sensation:  no  Fevers:  no  Relevant past medical, surgical, family and social history reviewed and updated as indicated. Interim medical history since our last visit reviewed. Allergies and medications reviewed and updated.  Review of Systems  Constitutional: Negative.   Respiratory: Negative.    Cardiovascular: Negative.   Gastrointestinal: Negative.   Musculoskeletal:  Positive for neck pain and neck stiffness. Negative for arthralgias, back pain, gait problem, joint swelling and myalgias.  Skin: Negative.   Neurological: Negative.   Psychiatric/Behavioral: Negative.      Per HPI unless specifically indicated above     Objective:    BP 110/73   Pulse 87   Temp 98.5 F (36.9 C) (Oral)   Ht 5\' 9"  (1.753 m)   Wt 186 lb 12.8 oz (84.7 kg)   SpO2 98%   BMI 27.59 kg/m   Wt Readings from Last 3 Encounters:  11/25/22 186 lb 12.8 oz (84.7 kg)  11/11/22 184 lb 6.4 oz (83.6 kg)  09/04/22 177 lb 6 oz (80.5 kg)    Physical Exam Vitals and nursing note reviewed.  Constitutional:      General: He is not in acute distress.    Appearance: Normal appearance. He is well-developed and normal weight.   HENT:     Head: Normocephalic and atraumatic.     Right Ear: Hearing and external ear normal.     Left Ear: Hearing and external ear normal.     Nose: Nose normal.     Mouth/Throat:     Mouth: Mucous membranes are moist.     Pharynx: Oropharynx is clear.  Eyes:     General: Lids are normal. No scleral icterus.       Right eye: No discharge.        Left eye: No discharge.     Conjunctiva/sclera: Conjunctivae normal.  Pulmonary:     Effort: Pulmonary effort is normal. No respiratory distress.  Musculoskeletal:        General: Tenderness (Middle scalene tendereness and hypertonicity on the L) present. Normal range of motion.  Skin:    Coloration: Skin is not jaundiced or pale.     Findings: No bruising, erythema, lesion or rash.  Neurological:     Mental Status: He is alert. Mental status is at baseline. He is disoriented.  Psychiatric:        Mood and Affect: Mood normal.        Speech: Speech normal.        Behavior: Behavior normal.        Thought Content: Thought content normal.  Judgment: Judgment normal.     Results for orders placed or performed in visit on 11/19/22  CBC With Diff/Platelet  Result Value Ref Range   WBC 6.7 3.4 - 10.8 x10E3/uL   RBC 4.90 4.14 - 5.80 x10E6/uL   Hemoglobin 14.9 13.0 - 17.7 g/dL   Hematocrit 40.9 81.1 - 51.0 %   MCV 91 79 - 97 fL   MCH 30.4 26.6 - 33.0 pg   MCHC 33.4 31.5 - 35.7 g/dL   RDW 91.4 78.2 - 95.6 %   Platelets 228 150 - 450 x10E3/uL   Neutrophils 61 Not Estab. %   Lymphs 23 Not Estab. %   Monocytes 9 Not Estab. %   Eos 5 Not Estab. %   Basos 1 Not Estab. %   Neutrophils Absolute 4.1 1.4 - 7.0 x10E3/uL   Lymphocytes Absolute 1.5 0.7 - 3.1 x10E3/uL   Monocytes Absolute 0.6 0.1 - 0.9 x10E3/uL   EOS (ABSOLUTE) 0.3 0.0 - 0.4 x10E3/uL   Basophils Absolute 0.1 0.0 - 0.2 x10E3/uL   Immature Granulocytes 1 Not Estab. %   Immature Grans (Abs) 0.1 0.0 - 0.1 x10E3/uL      Assessment & Plan:   Problem List Items  Addressed This Visit       Other   Neck pain - Primary   Relevant Orders   DG Cervical Spine Complete     Follow up plan: Return in about 2 weeks (around 12/09/2022).

## 2022-12-10 ENCOUNTER — Ambulatory Visit: Payer: BC Managed Care – PPO | Admitting: Family Medicine

## 2022-12-23 ENCOUNTER — Other Ambulatory Visit: Payer: Self-pay | Admitting: Family Medicine

## 2022-12-24 NOTE — Telephone Encounter (Signed)
Requested Prescriptions  Pending Prescriptions Disp Refills   meloxicam (MOBIC) 15 MG tablet [Pharmacy Med Name: MELOXICAM 15 MG TABLET] 90 tablet 0    Sig: TAKE 1 TABLET (15 MG TOTAL) BY MOUTH DAILY.     Analgesics:  COX2 Inhibitors Failed - 12/23/2022  2:41 AM      Failed - Manual Review: Labs are only required if the patient has taken medication for more than 8 weeks.      Passed - HGB in normal range and within 360 days    Hemoglobin  Date Value Ref Range Status  11/19/2022 14.9 13.0 - 17.7 g/dL Final         Passed - Cr in normal range and within 360 days    Creatinine, Ser  Date Value Ref Range Status  11/11/2022 0.97 0.76 - 1.27 mg/dL Final         Passed - HCT in normal range and within 360 days    Hematocrit  Date Value Ref Range Status  11/19/2022 44.6 37.5 - 51.0 % Final         Passed - AST in normal range and within 360 days    AST  Date Value Ref Range Status  11/11/2022 22 0 - 40 IU/L Final         Passed - ALT in normal range and within 360 days    ALT  Date Value Ref Range Status  11/11/2022 25 0 - 44 IU/L Final         Passed - eGFR is 30 or above and within 360 days    GFR calc Af Amer  Date Value Ref Range Status  06/30/2020 82 >59 mL/min/1.73 Final    Comment:    **In accordance with recommendations from the NKF-ASN Task force,**   [facility] is in the process of updating its eGFR calculation to the   2021 CKD-EPI creatinine equation that estimates kidney function   without a race variable.    GFR calc non Af Amer  Date Value Ref Range Status  06/30/2020 71 >59 mL/min/1.73 Final   eGFR  Date Value Ref Range Status  11/11/2022 89 >59 mL/min/1.73 Final         Passed - Patient is not pregnant      Passed - Valid encounter within last 12 months    Recent Outpatient Visits           4 weeks ago Neck pain   [facility] [place], Megan P, DO   1 month ago Neck pain   [place] [facility] [place],  Sherran Needs, NP   4 months ago Routine general medical examination at a health care facility   [facility], Megan P, DO   5 months ago COVID-19   [place] [facility] Mecum, Oswaldo Conroy, PA-C   1 year ago Grade I hemorrhoids   [place] [facility] Gabriel Cirri, NP       Future Appointments             In 2 months [name], MD [facility]   In 7 months Laural Benes, Oralia Rud, DO [facility]

## 2023-03-02 ENCOUNTER — Other Ambulatory Visit: Payer: Self-pay | Admitting: Nurse Practitioner

## 2023-03-03 ENCOUNTER — Other Ambulatory Visit: Payer: BC Managed Care – PPO

## 2023-03-03 DIAGNOSIS — R972 Elevated prostate specific antigen [PSA]: Secondary | ICD-10-CM

## 2023-03-04 LAB — PSA: Prostate Specific Ag, Serum: 4.8 ng/mL — ABNORMAL HIGH (ref 0.0–4.0)

## 2023-03-04 NOTE — Telephone Encounter (Signed)
D/C 08/07/22. Requested Prescriptions  Refused Prescriptions Disp Refills   omeprazole (PRILOSEC) 40 MG capsule [Pharmacy Med Name: OMEPRAZOLE DR 40 MG CAPSULE] 90 capsule 1    Sig: TAKE 1 CAPSULE BY MOUTH EVERY DAY     Gastroenterology: Proton Pump Inhibitors Passed - 03/02/2023  3:40 PM      Passed - Valid encounter within last 12 months    Recent Outpatient Visits           3 months ago Neck pain   [place] [facility] [place], Megan P, DO   3 months ago Neck pain   [place] [facility] [place], Sherran Needs, NP   6 months ago Routine general medical examination at a health care facility   [facility], Megan P, DO   8 months ago COVID-19   [place] [facility] Mecum, Oswaldo Conroy, PA-C   1 year ago Grade I hemorrhoids   [place] [facility] Gabriel Cirri, NP       Future Appointments             Tomorrow [name], MD [facility] [place]   In 5 months Dorcas Carrow, DO [place] [facility], [facility]

## 2023-03-05 ENCOUNTER — Encounter: Payer: Self-pay | Admitting: Urology

## 2023-03-05 ENCOUNTER — Ambulatory Visit: Payer: BC Managed Care – PPO | Admitting: Urology

## 2023-03-05 VITALS — BP 152/100 | HR 108 | Ht 69.0 in | Wt 190.0 lb

## 2023-03-05 DIAGNOSIS — R3912 Poor urinary stream: Secondary | ICD-10-CM | POA: Diagnosis not present

## 2023-03-05 DIAGNOSIS — N401 Enlarged prostate with lower urinary tract symptoms: Secondary | ICD-10-CM | POA: Diagnosis not present

## 2023-03-05 DIAGNOSIS — R972 Elevated prostate specific antigen [PSA]: Secondary | ICD-10-CM

## 2023-03-05 DIAGNOSIS — N528 Other male erectile dysfunction: Secondary | ICD-10-CM

## 2023-03-05 LAB — BLADDER SCAN AMB NON-IMAGING: PVR: 26 WU

## 2023-03-05 MED ORDER — TADALAFIL 20 MG PO TABS: 20.0000 mg | ORAL_TABLET | Freq: Every day | ORAL | 3 refills | Status: DC | PRN

## 2023-03-05 NOTE — Progress Notes (Signed)
Andres Dillon,acting as a scribe for Andres Scotland, MD.,have documented all relevant documentation on the behalf of Andres Scotland, MD,as directed by  Andres Scotland, MD while in the presence of Andres Scotland, MD.  03/05/2023 3:27 PM   Andres Dillon 1961/06/14 [id number]  Referring provider: Dorcas Carrow, DO [address] [place],  [place] [address]  Chief Complaint  Patient presents with   Elevated PSA   Benign Prostatic Hypertrophy    HPI: 62 year-old male who presents today for follow up.   He was initially seen and evaluated by me in 08/2022 for further evaluation of elevated PSA up to 4.8 at its peak in 06/2021. He also was having issues with weaker stream, needing to strain, incomplete bladder emptying, etc. His prostate exam showed prostatomegaly, but was otherwise unremarkable. He was put on Flomax to treat his urinary symptoms, along with sildenafil for his erectile dysfunction.   His most recent PSA on 03/03/2023 remains stable from a year ago at 4.8.   Today, he reports that Flomax has helped reduce post-void dribbling but still feels his prostate is enlarged, causing discomfort when sitting. He expresses concern about the potential for prostate cancer and is hesitant about undergoing a biopsy. He mentions a family history of prostate cancer, with his father diagnosed in his late 82s.  He reports that sildenafil has not been effective in helping with his erectile dysfunction. He would like to discuss alternatives.   Results for orders placed or performed in visit on 03/05/23  Bladder Scan (Post Void Residual) in office  Result Value Ref Range   PVR 26.0 WU     PMH: Past Medical History:  Diagnosis Date   Arthritis    GERD (gastroesophageal reflux disease)     Surgical History: Past Surgical History:  Procedure Laterality Date   COLONOSCOPY  2014   SPINE SURGERY  12/23/1995   fell/ rods and screws T10 /L2 fusions    Home Medications:  Allergies as of  03/05/2023   No Known Allergies      Medication List        Accurate as of March 05, 2023  3:27 PM. If you have any questions, ask your nurse or doctor.          cyclobenzaprine 10 MG tablet Commonly known as: FLEXERIL Take 1 tablet (10 mg total) by mouth 3 (three) times daily as needed for muscle spasms.   meloxicam 15 MG tablet Commonly known as: MOBIC TAKE 1 TABLET (15 MG TOTAL) BY MOUTH DAILY.   tadalafil 20 MG tablet Commonly known as: CIALIS Take 1 tablet (20 mg total) by mouth daily as needed for erectile dysfunction. Started by: Andres Dillon   tamsulosin 0.4 MG Caps capsule Commonly known as: FLOMAX Take 1 capsule (0.4 mg total) by mouth daily.        Family History: Family History  Problem Relation Age of Onset   Cancer Mother    Cancer Father    Heart disease Father 57       CABG    Kidney disease Father     Social History:  reports that he has never smoked. He has never used smokeless tobacco. He reports current alcohol use of about 12.0 standard drinks of alcohol per week. He reports that he does not use drugs.   Physical Exam: BP (!) 152/100   Pulse (!) 108   Ht 5\' 9"  (1.753 m)   Wt 190 lb (86.2 kg)  BMI 28.06 kg/m   Constitutional:  Alert and oriented, No acute distress. HEENT: [place] AT, moist mucus membranes.  Trachea midline, no masses. Neurologic: Grossly intact, no focal deficits, moving all 4 extremities. Psychiatric: Normal mood and affect.   Assessment & Plan:    1. Elevated PSA/ BPH - Continue Flomax for urinary symptoms. - Discussed the potential use of finasteride to shrink the prostate, with the understanding that it may complicate PSA interpretation. - Consider MRI if the patient remains concerned about prostate cancer, understanding the limitations and potential for false positives/negatives. - Follow-up PSA in six months; stable at this time from previous -conversations about risk/ benefits of further work up lengthy;  also called brother during visit who is a radiation oncologist  2. Erectile dysfunction - Switch from sildenafil to Cialis to see if it provides better efficacy - Education provided on the proper use of erectile dysfunction medications, including timing and the impact of food intake.  Return in about 6 months (around 09/02/2023), or IPPS , PVR PSA.  I have reviewed the above documentation for accuracy and completeness, and I agree with the above.   Andres Scotland, MD   I spent 33 total minutes on the day of the encounter including pre-visit review of the medical record, face-to-face time with the patient, and post visit ordering of labs/imaging/tests.  [facility] [address] [place], [place] [address] [phone]

## 2023-05-29 ENCOUNTER — Other Ambulatory Visit: Payer: Self-pay | Admitting: Urology

## 2023-05-29 NOTE — Addendum Note (Signed)
Addended by: Consuella Lose on: 05/29/2023 08:11 AM   Modules accepted: Orders

## 2023-08-11 ENCOUNTER — Ambulatory Visit: Payer: BC Managed Care – PPO | Admitting: Family Medicine

## 2023-08-11 ENCOUNTER — Encounter: Payer: Self-pay | Admitting: Family Medicine

## 2023-08-11 VITALS — BP 126/85 | HR 86 | Temp 98.2°F | Ht 69.0 in | Wt 186.0 lb

## 2023-08-11 DIAGNOSIS — Z0000 Encounter for general adult medical examination without abnormal findings: Secondary | ICD-10-CM | POA: Diagnosis not present

## 2023-08-11 DIAGNOSIS — G4700 Insomnia, unspecified: Secondary | ICD-10-CM

## 2023-08-11 DIAGNOSIS — K2100 Gastro-esophageal reflux disease with esophagitis, without bleeding: Secondary | ICD-10-CM | POA: Diagnosis not present

## 2023-08-11 DIAGNOSIS — Z23 Encounter for immunization: Secondary | ICD-10-CM

## 2023-08-11 LAB — BAYER DCA HB A1C WAIVED: HB A1C (BAYER DCA - WAIVED): 5.3 % (ref 4.8–5.6)

## 2023-08-11 MED ORDER — TRAZODONE HCL 50 MG PO TABS: 25.0000 mg | ORAL_TABLET | Freq: Every evening | ORAL | 3 refills | Status: DC | PRN

## 2023-08-11 MED ORDER — FLUTICASONE PROPIONATE 50 MCG/ACT NA SUSP: 2.0000 | Freq: Two times a day (BID) | NASAL | 4 refills | Status: DC

## 2023-08-11 MED ORDER — PANTOPRAZOLE SODIUM 40 MG PO TBEC
40.0000 mg | DELAYED_RELEASE_TABLET | Freq: Every day | ORAL | 1 refills | Status: DC
Start: 2023-08-11 — End: 2024-03-23

## 2023-08-11 NOTE — Progress Notes (Signed)
BP 126/85 (BP Location: Right Arm, Patient Position: Sitting, Cuff Size: Normal)   Pulse 86   Temp 98.2 F (36.8 C) (Oral)   Ht 5\' 9"  (1.753 m)   Wt 186 lb (84.4 kg)   SpO2 98%   BMI 27.47 kg/m    Subjective:    Patient ID: Andres Dillon, male    DOB: [birth year], 63 y.o.   MRN: [id number]  HPI: SHAIN PAUWELS is a 63 y.o. male presenting on 08/11/2023 for comprehensive medical examination. Current medical complaints include:  INSOMNIA Duration: couple of months Satisfied with sleep quality: yes Difficulty falling asleep: yes Difficulty staying asleep: no Waking a few hours after sleep onset: no Early morning awakenings: no Daytime hypersomnolence: no Wakes feeling refreshed: no Good sleep hygiene: yes Apnea: no Snoring: maybe Depressed/anxious mood: yes Recent stress: no Restless legs/nocturnal leg cramps: no Chronic pain/arthritis: no History of sleep study: no Treatments attempted: none   GERD GERD control status: exacerbated Satisfied with current treatment? no Heartburn frequency: occasionally Medication side effects: no  Medication compliance: fair Dysphagia: no Odynophagia:  no Hematemesis: no Blood in stool: no EGD: no  Interim Problems from his last visit: no  Depression Screen done today and results listed below:     08/11/2023    2:20 PM 11/11/2022   11:34 AM 08/07/2022    8:32 AM 10/02/2021    2:33 PM 07/17/2021    2:29 PM  Depression screen PHQ 2/9  Decreased Interest 0 0 0 0 1  Down, Depressed, Hopeless 0 0 0 1 2  PHQ - 2 Score 0 0 0 1 3  Altered sleeping 2 2 1 1 1   Tired, decreased energy 1 2 1 1 1   Change in appetite 0 1 0 2 1  Feeling bad or failure about yourself  0 0 0 0 0  Trouble concentrating 0 1 0 0 0  Moving slowly or fidgety/restless 0 1 0 0 0  Suicidal thoughts 0 0 0 0 0  PHQ-9 Score 3 7 2 5 6   Difficult doing work/chores  Somewhat difficult Not difficult at all Not difficult at all     Past Medical History:  Past Medical  History:  Diagnosis Date   Arthritis    GERD (gastroesophageal reflux disease)     Surgical History:  Past Surgical History:  Procedure Laterality Date   COLONOSCOPY  2014   SPINE SURGERY  12/23/1995   fell/ rods and screws T10 /L2 fusions    Medications:  Current Outpatient Medications on File Prior to Visit  Medication Sig   tamsulosin (FLOMAX) 0.4 MG CAPS capsule TAKE 1 CAPSULE BY MOUTH EVERY DAY   tadalafil (CIALIS) 20 MG tablet Take 1 tablet (20 mg total) by mouth daily as needed for erectile dysfunction.   No current facility-administered medications on file prior to visit.    Allergies:  No Known Allergies  Social History:  Social History   Socioeconomic History   Marital status: Single    Spouse name: Not on file   Number of children: Not on file   Years of education: Not on file   Highest education level: Not on file  Occupational History   Not on file  Tobacco Use   Smoking status: Never   Smokeless tobacco: Never  Vaping Use   Vaping status: Never Used  Substance and Sexual Activity   Alcohol use: Yes    Alcohol/week: 12.0 standard drinks of alcohol    Types: 12 Cans of  beer per week   Drug use: No   Sexual activity: Yes  Other Topics Concern   Not on file  Social History Narrative   Not on file   Social Drivers of Health   Financial Resource Strain: Not on file  Food Insecurity: Not on file  Transportation Needs: Not on file  Physical Activity: Not on file  Stress: Not on file  Social Connections: Not on file  Intimate Partner Violence: Not on file   Social History   Tobacco Use  Smoking Status Never  Smokeless Tobacco Never   Social History   Substance and Sexual Activity  Alcohol Use Yes   Alcohol/week: 12.0 standard drinks of alcohol   Types: 12 Cans of beer per week    Family History:  Family History  Problem Relation Age of Onset   Cancer Mother    Cancer Father    Heart disease Father 72       CABG    Kidney disease  Father     Past medical history, surgical history, medications, allergies, family history and social history reviewed with patient today and changes made to appropriate areas of the chart.   Review of Systems  Constitutional: Negative.   HENT: Negative.    Eyes: Negative.   Respiratory: Negative.    Cardiovascular: Negative.   Gastrointestinal:  Positive for heartburn. Negative for abdominal pain, blood in stool, constipation, diarrhea, melena, nausea and vomiting.  Genitourinary: Negative.   Musculoskeletal: Negative.   Skin: Negative.   Neurological: Negative.   Endo/Heme/Allergies: Negative.   Psychiatric/Behavioral: Negative.     All other ROS negative except what is listed above and in the HPI.      Objective:    BP 126/85 (BP Location: Right Arm, Patient Position: Sitting, Cuff Size: Normal)   Pulse 86   Temp 98.2 F (36.8 C) (Oral)   Ht 5\' 9"  (1.753 m)   Wt 186 lb (84.4 kg)   SpO2 98%   BMI 27.47 kg/m   Wt Readings from Last 3 Encounters:  08/11/23 186 lb (84.4 kg)  03/05/23 190 lb (86.2 kg)  11/25/22 186 lb 12.8 oz (84.7 kg)    Physical Exam Vitals and nursing note reviewed.  Constitutional:      General: He is not in acute distress.    Appearance: Normal appearance. He is not ill-appearing, toxic-appearing or diaphoretic.  HENT:     Head: Normocephalic and atraumatic.     Right Ear: Tympanic membrane, ear canal and external ear normal. There is no impacted cerumen.     Left Ear: Tympanic membrane, ear canal and external ear normal. There is no impacted cerumen.     Nose: Nose normal. No congestion or rhinorrhea.     Mouth/Throat:     Mouth: Mucous membranes are moist.     Pharynx: Oropharynx is clear. No oropharyngeal exudate or posterior oropharyngeal erythema.  Eyes:     General: No scleral icterus.       Right eye: No discharge.        Left eye: No discharge.     Extraocular Movements: Extraocular movements intact.     Conjunctiva/sclera:  Conjunctivae normal.     Pupils: Pupils are equal, round, and reactive to light.  Neck:     Vascular: No carotid bruit.  Cardiovascular:     Rate and Rhythm: Normal rate and regular rhythm.     Pulses: Normal pulses.     Heart sounds: No murmur heard.  No friction rub. No gallop.  Pulmonary:     Effort: Pulmonary effort is normal. No respiratory distress.     Breath sounds: Normal breath sounds. No stridor. No wheezing, rhonchi or rales.  Chest:     Chest wall: No tenderness.  Abdominal:     General: Abdomen is flat. Bowel sounds are normal. There is no distension.     Palpations: Abdomen is soft. There is no mass.     Tenderness: There is no abdominal tenderness. There is no right CVA tenderness, left CVA tenderness, guarding or rebound.     Hernia: No hernia is present.  Genitourinary:    Comments: Genital exam deferred with shared decision making Musculoskeletal:        General: No swelling, tenderness, deformity or signs of injury.     Cervical back: Normal range of motion and neck supple. No rigidity. No muscular tenderness.     Right lower leg: No edema.     Left lower leg: No edema.  Lymphadenopathy:     Cervical: No cervical adenopathy.  Skin:    General: Skin is warm and dry.     Capillary Refill: Capillary refill takes less than 2 seconds.     Coloration: Skin is not jaundiced or pale.     Findings: No bruising, erythema, lesion or rash.  Neurological:     General: No focal deficit present.     Mental Status: He is alert and oriented to person, place, and time.     Cranial Nerves: No cranial nerve deficit.     Sensory: No sensory deficit.     Motor: No weakness.     Coordination: Coordination normal.     Gait: Gait normal.     Deep Tendon Reflexes: Reflexes normal.  Psychiatric:        Mood and Affect: Mood normal.        Behavior: Behavior normal.        Thought Content: Thought content normal.        Judgment: Judgment normal.     Results for orders  placed or performed in visit on 03/05/23  Bladder Scan (Post Void Residual) in office   Collection Time: 03/05/23  2:19 PM  Result Value Ref Range   PVR 26.0 WU      Assessment & Plan:   Problem List Items Addressed This Visit       Digestive   Gastroesophageal reflux disease with esophagitis   Will start him on protonix. Call with any concerns. Continue to monitor.       Other Visit Diagnoses       Routine general medical examination at a health care facility    -  Primary   Vaccines up to date/declined. Screening labs checked today. Colonoscopy up to date. Continue diet and exercise. Call with any concerns.   Relevant Orders   Comprehensive metabolic panel   CBC with Differential/Platelet   Lipid Panel w/o Chol/HDL Ratio   PSA   TSH   Bayer DCA Hb A1c Waived     Insomnia, unspecified type       Will start PRN trazadone. Call with any concerns. Continue to monitor.     Need for diphtheria-tetanus-pertussis (Tdap) vaccine       Tdap given today.   Relevant Orders   Tdap vaccine greater than or equal to 7yo IM        LABORATORY TESTING:  Health maintenance labs ordered today as discussed above.   The natural history  of prostate cancer and ongoing controversy regarding screening and potential treatment outcomes of prostate cancer has been discussed with the patient. The meaning of a false positive PSA and a false negative PSA has been discussed. He indicates understanding of the limitations of this screening test and wishes to proceed with screening PSA testing.   IMMUNIZATIONS:   - Tdap: Tetanus vaccination status reviewed: Tdap vaccination indicated and given today. - Influenza: Refused - Pneumovax: Not applicable - Prevnar: Refused - COVID: Refused - HPV: Not applicable - Shingrix vaccine: Up to date\  SCREENING: - Colonoscopy: Up to date  Discussed with patient purpose of the colonoscopy is to detect colon cancer at curable precancerous or early stages    PATIENT COUNSELING:    Sexuality: Discussed sexually transmitted diseases, partner selection, use of condoms, avoidance of unintended pregnancy  and contraceptive alternatives.   Advised to avoid cigarette smoking.  I discussed with the patient that most people either abstain from alcohol or drink within safe limits (<=14/week and <=4 drinks/occasion for males, <=7/weeks and <= 3 drinks/occasion for females) and that the risk for alcohol disorders and other health effects rises proportionally with the number of drinks per week and how often a drinker exceeds daily limits.  Discussed cessation/primary prevention of drug use and availability of treatment for abuse.   Diet: Encouraged to adjust caloric intake to maintain  or achieve ideal body weight, to reduce intake of dietary saturated fat and total fat, to limit sodium intake by avoiding high sodium foods and not adding table salt, and to maintain adequate dietary potassium and calcium preferably from fresh fruits, vegetables, and low-fat dairy products.    stressed the importance of regular exercise  Injury prevention: Discussed safety belts, safety helmets, smoke detector, smoking near bedding or upholstery.   Dental health: Discussed importance of regular tooth brushing, flossing, and dental visits.   Follow up plan: NEXT PREVENTATIVE PHYSICAL DUE IN 1 YEAR. Return in about 1 year (around 08/10/2024) for physical.

## 2023-08-11 NOTE — Assessment & Plan Note (Signed)
Will start him on protonix. Call with any concerns. Continue to monitor.

## 2023-08-12 ENCOUNTER — Encounter: Payer: Self-pay | Admitting: Family Medicine

## 2023-08-12 DIAGNOSIS — J3489 Other specified disorders of nose and nasal sinuses: Secondary | ICD-10-CM | POA: Diagnosis not present

## 2023-08-12 DIAGNOSIS — J342 Deviated nasal septum: Secondary | ICD-10-CM | POA: Diagnosis not present

## 2023-08-12 LAB — COMPREHENSIVE METABOLIC PANEL
ALT: 15 [IU]/L (ref 0–44)
AST: 17 [IU]/L (ref 0–40)
Albumin: 4.4 g/dL (ref 3.9–4.9)
Alkaline Phosphatase: 60 [IU]/L (ref 44–121)
BUN/Creatinine Ratio: 16 (ref 10–24)
BUN: 18 mg/dL (ref 8–27)
Bilirubin Total: 0.4 mg/dL (ref 0.0–1.2)
CO2: 25 mmol/L (ref 20–29)
Calcium: 10 mg/dL (ref 8.6–10.2)
Chloride: 103 mmol/L (ref 96–106)
Creatinine, Ser: 1.1 mg/dL (ref 0.76–1.27)
Globulin, Total: 2.1 g/dL (ref 1.5–4.5)
Glucose: 72 mg/dL (ref 70–99)
Potassium: 4.3 mmol/L (ref 3.5–5.2)
Sodium: 141 mmol/L (ref 134–144)
Total Protein: 6.5 g/dL (ref 6.0–8.5)
eGFR: 76 mL/min/{1.73_m2} (ref >59)

## 2023-08-12 LAB — CBC WITH DIFFERENTIAL/PLATELET
Basophils Absolute: 0 10*3/uL (ref 0.0–0.2)
Basos: 1 %
EOS (ABSOLUTE): 0.2 10*3/uL (ref 0.0–0.4)
Eos: 2 %
Hematocrit: 43.6 % (ref 37.5–51.0)
Hemoglobin: 14.6 g/dL (ref 13.0–17.7)
Immature Grans (Abs): 0 10*3/uL (ref 0.0–0.1)
Immature Granulocytes: 0 %
Lymphocytes Absolute: 1.4 10*3/uL (ref 0.7–3.1)
Lymphs: 21 %
MCH: 30.2 pg (ref 26.6–33.0)
MCHC: 33.5 g/dL (ref 31.5–35.7)
MCV: 90 fL (ref 79–97)
Monocytes Absolute: 0.5 10*3/uL (ref 0.1–0.9)
Monocytes: 8 %
Neutrophils Absolute: 4.7 10*3/uL (ref 1.4–7.0)
Neutrophils: 68 %
Platelets: 256 10*3/uL (ref 150–450)
RBC: 4.83 x10E6/uL (ref 4.14–5.80)
RDW: 12.9 % (ref 11.6–15.4)
WBC: 6.8 10*3/uL (ref 3.4–10.8)

## 2023-08-12 LAB — PSA: Prostate Specific Ag, Serum: 4.9 ng/mL — ABNORMAL HIGH (ref 0.0–4.0)

## 2023-08-12 LAB — LIPID PANEL W/O CHOL/HDL RATIO
Cholesterol, Total: 194 mg/dL (ref 100–199)
HDL: 41 mg/dL (ref >39)
LDL Chol Calc (NIH): 101 mg/dL — ABNORMAL HIGH (ref 0–99)
Triglycerides: 310 mg/dL — ABNORMAL HIGH (ref 0–149)
VLDL Cholesterol Cal: 52 mg/dL — ABNORMAL HIGH (ref 5–40)

## 2023-08-12 LAB — TSH: TSH: 1.19 u[IU]/mL (ref 0.450–4.500)

## 2023-08-13 ENCOUNTER — Encounter: Payer: Self-pay | Admitting: Family Medicine

## 2023-08-15 DIAGNOSIS — J342 Deviated nasal septum: Secondary | ICD-10-CM | POA: Diagnosis not present

## 2023-08-26 DIAGNOSIS — J342 Deviated nasal septum: Secondary | ICD-10-CM | POA: Diagnosis not present

## 2023-08-26 DIAGNOSIS — J301 Allergic rhinitis due to pollen: Secondary | ICD-10-CM | POA: Diagnosis not present

## 2023-09-02 ENCOUNTER — Ambulatory Visit: Payer: BC Managed Care – PPO | Admitting: Urology

## 2023-09-02 VITALS — BP 146/83 | HR 112 | Ht 69.0 in | Wt 183.4 lb

## 2023-09-02 DIAGNOSIS — Z87898 Personal history of other specified conditions: Secondary | ICD-10-CM | POA: Diagnosis not present

## 2023-09-02 DIAGNOSIS — R3912 Poor urinary stream: Secondary | ICD-10-CM

## 2023-09-02 DIAGNOSIS — N401 Enlarged prostate with lower urinary tract symptoms: Secondary | ICD-10-CM | POA: Diagnosis not present

## 2023-09-02 LAB — BLADDER SCAN AMB NON-IMAGING: Scan Result: 12

## 2023-09-02 MED ORDER — TADALAFIL 20 MG PO TABS: 20.0000 mg | ORAL_TABLET | Freq: Every day | ORAL | 11 refills | Status: AC | PRN

## 2023-09-02 NOTE — Progress Notes (Signed)
 I,Andres Dillon,acting as a scribe for Andres Scotland, MD.,have documented all relevant documentation on the behalf of Andres Scotland, MD,as directed by  Andres Scotland, MD while in the presence of Andres Scotland, MD.  09/02/2023 4:34 PM   Andres Dillon Aug 05, 1960 [id number]  Referring provider: Dorcas Carrow, DO [address] [place],  [place] [address]  Chief Complaint  Patient presents with   Benign Prostatic Hypertrophy    HPI: 63 year-old male with a personal history of BPH and erectile dysfunction presents today for a six month follow-up.  He was started on Flomax and his urinary symptoms improved.  He switched from Sildenafil for his erectile dysfunction, to Cialis to see if that helped with his urinary symptoms as well.   His most recent PSA is stable in the 4 range.  He is concerned about his PSA levels and started taking lycopene in an attempt to maintain or reduce his PSA levels.   He inquired about testosterone levels due to low sex drive. He got a supplement for it and started taking it a few days ago. But is willing to discontinue taking it if it will affect his PSA.  PSA Trend:  12/18/2016     2.3 01/01/2018     2.8 05/27/2019     2.6 06/30/2020       3.1 07/17/2021     4.8 08/07/2022     4.6 03/03/2023       4.8 08/11/2023     4.9    IPSS     Row Name 09/02/23 1500         International Prostate Symptom Score   How often have you had the sensation of not emptying your bladder? Less than 1 in 5     How often have you had to urinate less than every two hours? Less than half the time     How often have you found you stopped and started again several times when you urinated? About half the time     How often have you found it difficult to postpone urination? Not at All     How often have you had a weak urinary stream? About half the time     How often have you had to strain to start urination? Less than 1 in 5 times     How many times did you typically get up at  night to urinate? 2 Times     Total IPSS Score 12       Quality of Life due to urinary symptoms   If you were to spend the rest of your life with your urinary condition just the way it is now how would you feel about that? Mixed            Score:  1-7 Mild 8-19 Moderate 20-35 Severe Results for orders placed or performed in visit on 09/02/23  Bladder Scan (Post Void Residual) in office  Result Value Ref Range   Scan Result 12 ml     PMH: Past Medical History:  Diagnosis Date   Arthritis    GERD (gastroesophageal reflux disease)     Surgical History: Past Surgical History:  Procedure Laterality Date   COLONOSCOPY  2014   SPINE SURGERY  12/23/1995   fell/ rods and screws T10 /L2 fusions    Home Medications:  Allergies as of 09/02/2023   No Known Allergies      Medication List  Accurate as of September 02, 2023  4:34 PM. If you have any questions, ask your nurse or doctor.          STOP taking these medications    traZODone 50 MG tablet Commonly known as: DESYREL       TAKE these medications    fluticasone 50 MCG/ACT nasal spray Commonly known as: FLONASE Place 2 sprays into both nostrils 2 (two) times daily.   pantoprazole 40 MG tablet Commonly known as: PROTONIX Take 1 tablet (40 mg total) by mouth daily.   tadalafil 20 MG tablet Commonly known as: CIALIS Take 1 tablet (20 mg total) by mouth daily as needed for erectile dysfunction.   tamsulosin 0.4 MG Caps capsule Commonly known as: FLOMAX TAKE 1 CAPSULE BY MOUTH EVERY DAY        Family History: Family History  Problem Relation Age of Onset   Cancer Mother    Cancer Father    Heart disease Father 29       CABG    Kidney disease Father     Social History:  reports that he has never smoked. He has never used smokeless tobacco. He reports current alcohol use of about 12.0 standard drinks of alcohol per week. He reports that he does not use drugs.   Physical Exam: BP (!) 146/83    Pulse (!) 112   Ht 5\' 9"  (1.753 m)   Wt 183 lb 6 oz (83.2 kg)   BMI 27.08 kg/m   Constitutional:  Alert and oriented, No acute distress. HEENT: [place] AT, moist mucus membranes.  Trachea midline, no masses. Neurologic: Grossly intact, no focal deficits, moving all 4 extremities. Psychiatric: Normal mood and affect.   Assessment & Plan:    1. History of elevated PSA - Current PSA level is likely due to an enlarged prostate rather than a concerning rise. Explained that a PSA increase of more than 0.75 annually would be more concerning. No immediate need for a biopsy is indicated at this time.  -  We reviewed the implications of an elevated PSA and the uncertainty surrounding it. In general, a man's PSA increases with age and is produced by both normal and cancerous prostate tissue. Differential for elevated PSA is BPH, prostate cancer, infection, recent intercourse/ejaculation, prostate infarction, recent urethroscopic manipulation (foley placement/cystoscopy) and prostatitis. Management of an elevated PSA can include observation or prostate biopsy and we discussed this in detail. Discussed that indications for prostate biopsy are defined by age and race specific PSA cutoffs as well as a PSA velocity of 0.75/year.  2. Benign Prostatic Hyperplasia (BPH) with Lower Urinary Tract Symptoms (LUTS) - Symptoms are currently managed with Flomax. Given the switch from Sildenafil to Cialis to potentially aid urinary symptoms, no significant difference between the two medications.   3. Erectile Dysfunction - Continue on Cialis. Advised taking it on an empty stomach and at least two hours before sexual activity for optimal effect.  -Questions the role of possible hypogonadism.  He would like to have this checked.  We did discuss that if he does have low testosterone, we will have to follow his PSA extremely closely if he elects treatment.  This could exacerbate his issues.    Return in about 1 year (around  09/01/2024) for PSA, IPSS, PVR, testosterone.  I have reviewed the above documentation for accuracy and completeness, and I agree with the above.   Andres Scotland, MD   [facility] [address], [address] [place],  [place] [address] [phone]

## 2023-09-15 ENCOUNTER — Other Ambulatory Visit

## 2023-09-15 DIAGNOSIS — R3912 Poor urinary stream: Secondary | ICD-10-CM | POA: Diagnosis not present

## 2023-09-15 DIAGNOSIS — N401 Enlarged prostate with lower urinary tract symptoms: Secondary | ICD-10-CM

## 2023-09-16 ENCOUNTER — Encounter: Payer: Self-pay | Admitting: Urology

## 2023-09-16 LAB — TESTOSTERONE: Testosterone: 365 ng/dL (ref 264–916)

## 2023-10-03 ENCOUNTER — Other Ambulatory Visit: Payer: Self-pay | Admitting: Urology

## 2024-01-19 ENCOUNTER — Telehealth: Admitting: Family Medicine

## 2024-01-19 ENCOUNTER — Ambulatory Visit: Payer: Self-pay

## 2024-01-19 DIAGNOSIS — J40 Bronchitis, not specified as acute or chronic: Secondary | ICD-10-CM

## 2024-01-19 DIAGNOSIS — B9689 Other specified bacterial agents as the cause of diseases classified elsewhere: Secondary | ICD-10-CM

## 2024-01-19 DIAGNOSIS — J019 Acute sinusitis, unspecified: Secondary | ICD-10-CM

## 2024-01-19 MED ORDER — PREDNISONE 20 MG PO TABS: 20.0000 mg | ORAL_TABLET | Freq: Two times a day (BID) | ORAL | 0 refills | Status: AC

## 2024-01-19 MED ORDER — PROMETHAZINE-DM 6.25-15 MG/5ML PO SYRP: 5.0000 mL | ORAL_SOLUTION | Freq: Four times a day (QID) | ORAL | 0 refills | Status: AC | PRN

## 2024-01-19 MED ORDER — AMOXICILLIN-POT CLAVULANATE 875-125 MG PO TABS: 1.0000 | ORAL_TABLET | Freq: Two times a day (BID) | ORAL | 0 refills | Status: DC

## 2024-01-19 NOTE — Telephone Encounter (Signed)
 Reason for Disposition . Wheezing is present  Answer Assessment - Initial Assessment Questions 1. ONSET: When did the cough begin?      1 week  2. SEVERITY: How bad is the cough today?      frequent 3. SPUTUM: Describe the color of your sputum (e.g., none, dry cough; clear, white, yellow, green)     Green  4. HEMOPTYSIS: Are you coughing up any blood? If Yes, ask: How much? (e.g., flecks, streaks, tablespoons, etc.)     1 episode 5. DIFFICULTY BREATHING: Are you having difficulty breathing? If Yes, ask: How bad is it? (e.g., mild, moderate, severe)      Mild wheezing  6. FEVER: Do you have a fever? If Yes, ask: What is your temperature, how was it measured, and when did it start?     no 10. OTHER SYMPTOMS: Do you have any other symptoms? (e.g., runny nose, wheezing, chest pain)       wheezing  Protocols used: Cough - Acute Productive-A-AH

## 2024-01-19 NOTE — Patient Instructions (Signed)

## 2024-01-19 NOTE — Telephone Encounter (Addendum)
 FYI Only or Action Required?: FYI only for provider.  Patient was last seen in primary care on 08/11/2023 by Vicci Duwaine SQUIBB, DO.  Called Nurse Triage reporting Sinusitis.  Symptoms began a week ago.  Interventions attempted: Nothing.  Symptoms are: gradually worsening.  Triage Disposition: Call PCP Within 24 Hours  Patient/caregiver understands and will follow disposition?: Virtual Telehealth visit

## 2024-01-19 NOTE — Progress Notes (Signed)
 Virtual Visit Consent   COURTEZ TWADDLE, you are scheduled for a virtual visit with a  provider today. Just as with appointments in the office, your consent must be obtained to participate. Your consent will be active for this visit and any virtual visit you may have with one of our providers in the next 365 days. If you have a MyChart account, a copy of this consent can be sent to you electronically.  As this is a virtual visit, video technology does not allow for your provider to perform a traditional examination. This may limit your provider's ability to fully assess your condition. If your provider identifies any concerns that need to be evaluated in person or the need to arrange testing (such as labs, EKG, etc.), we will make arrangements to do so. Although advances in technology are sophisticated, we cannot ensure that it will always work on either your end or our end. If the connection with a video visit is poor, the visit may have to be switched to a telephone visit. With either a video or telephone visit, we are not always able to ensure that we have a secure connection.  By engaging in this virtual visit, you consent to the provision of healthcare and authorize for your insurance to be billed (if applicable) for the services provided during this visit. Depending on your insurance coverage, you may receive a charge related to this service.  I need to obtain your verbal consent now. Are you willing to proceed with your visit today? Andres Dillon has provided verbal consent on 01/19/2024 for a virtual visit (video or telephone). Loa Lamp, FNP  Date: 01/19/2024 12:19 PM   Virtual Visit via Video Note   I, Loa Lamp, connected with  Andres Dillon  ([id number], [birth year]) on 01/19/24 at 12:15 PM EDT by a video-enabled telemedicine application and verified that I am speaking with the correct person using two identifiers.  Location: Patient: Virtual Visit Location Patient:  Home Provider: Virtual Visit Location Provider: Home Office   I discussed the limitations of evaluation and management by telemedicine and the availability of in person appointments. The patient expressed understanding and agreed to proceed.    History of Present Illness: Andres Dillon is a 63 y.o. who identifies as a male who was assigned male at birth, and is being seen today for cough productive with green mucus keeping him up at night. Sx for over a week. He also report sinus pressure with post nasal drainage. No fever. Slight wheezing. SABRA  HPI: HPI  Problems:  Patient Active Problem List   Diagnosis Date Noted   DDD (degenerative disc disease), thoracolumbar 01/01/2018   ED (erectile dysfunction) 01/01/2018   FH: colon cancer 10/16/2017   Hx of adenomatous colonic polyps 10/16/2017   Gastroesophageal reflux disease with esophagitis 10/16/2017   S/P lumbar fusion 12/18/2016   Neck pain 07/18/2015    Allergies: No Known Allergies Medications:  Current Outpatient Medications:    fluticasone  (FLONASE ) 50 MCG/ACT nasal spray, Place 2 sprays into both nostrils 2 (two) times daily., Disp: 16 g, Rfl: 4   pantoprazole  (PROTONIX ) 40 MG tablet, Take 1 tablet (40 mg total) by mouth daily., Disp: 90 tablet, Rfl: 1   tadalafil  (CIALIS ) 20 MG tablet, Take 1 tablet (20 mg total) by mouth daily as needed for erectile dysfunction., Disp: 30 tablet, Rfl: 11   tamsulosin  (FLOMAX ) 0.4 MG CAPS capsule, TAKE 1 CAPSULE BY MOUTH EVERY DAY, Disp: 90 capsule, Rfl: 2  Observations/Objective: Patient is well-developed, well-nourished in no acute distress.  Resting comfortably  at home.  Head is normocephalic, atraumatic.  No labored breathing.  Speech is clear and coherent with logical content.  Patient is alert and oriented at baseline.    Assessment and Plan: 1. Acute bacterial sinusitis (Primary)  2. Bronchitis  Increase fuids, humidifier at night, mucinex, UC as needed.   Follow Up  Instructions: I discussed the assessment and treatment plan with the patient. The patient was provided an opportunity to ask questions and all were answered. The patient agreed with the plan and demonstrated an understanding of the instructions.  A copy of instructions were sent to the patient via MyChart unless otherwise noted below.     The patient was advised to call back or seek an in-person evaluation if the symptoms worsen or if the condition fails to improve as anticipated.    Cicely Ortner, FNP

## 2024-01-19 NOTE — Telephone Encounter (Signed)
 Copied from CRM #[id number]. Topic: Clinical - Medical Advice >> Jan 19, 2024  8:40 AM Gustabo D wrote: Patient thinks he has a sinus or upper respiratory infection for about a week. [facility]. >> Jan 19, 2024 11:39 AM Winona R wrote: Pt returning call. Pt states he didn't receive a call and confirmed appointment 3 times.   Patient is scheduled for a virtual visit today and is that call; no triage.

## 2024-01-19 NOTE — Telephone Encounter (Signed)
 First attempt; no answer   Copied from CRM [id number]. Topic: Clinical - Medical Advice >> Jan 19, 2024  8:40 AM Gustabo D wrote: Patient thinks he has a sinus or upper respiratory infection for about a week. [facility].

## 2024-02-02 DIAGNOSIS — N50812 Left testicular pain: Secondary | ICD-10-CM | POA: Diagnosis not present

## 2024-02-02 DIAGNOSIS — N50811 Right testicular pain: Secondary | ICD-10-CM | POA: Diagnosis not present

## 2024-02-02 DIAGNOSIS — S39013A Strain of muscle, fascia and tendon of pelvis, initial encounter: Secondary | ICD-10-CM | POA: Diagnosis not present

## 2024-02-02 NOTE — Progress Notes (Signed)
 Subjective:     Patient ID: GABRIELLA GUILE is a 63 y.o. male.  Groin Pain  : PRESENTS WITH WHAT HE THINKS IS A GROIN STRAIN. STARTED A MONTH AGO. LAST PM NOTED SOME SHARPE BILATERAL TESTICULAR PAIN. NO DYSURIA. HX OF BPH. NO SWELLING. RELIEF WITH TYLENOL. RECORDS REVIEWED.   Past Medical History:  has a past medical history of Arthritis, FH: colon cancer (10/16/2017), Gastroesophageal reflux disease with esophagitis (10/16/2017), GERD (gastroesophageal reflux disease), and adenomatous colonic polyps (10/16/2017). Past Surgical History:  has a past surgical history that includes egd (10/27/2012); Colonoscopy (10/27/2012, 07/06/2009); Back surgery (7/1/197); Colonoscopy (11/27/2017); Colon @ [facility] (12/27/2021); and EGD @ [facility] (12/27/2021). Family History: family history includes Cirrhosis in his paternal grandfather; Colon cancer in his father; Hodgkin's lymphoma in his mother; Kidney failure in his father; Prostate cancer in his father. Social History:  reports that he has never smoked. He has never been exposed to tobacco smoke. He has never used smokeless tobacco. He reports current alcohol use. He reports that he does not use drugs. Prior to encounter Medications:  Current Outpatient Medications on File Prior to Visit  Medication Sig Dispense Refill  . albuterol  MDI, PROVENTIL , VENTOLIN , PROAIR , HFA 90 mcg/actuation inhaler Inhale 2 inhalations into the lungs    . budesonide-formoteroL (SYMBICORT) 80-4.5 mcg/actuation inhaler Inhale 2 inhalations into the lungs 2 (two) times daily    . colchicine (COLCRYS) 0.6 mg tablet Take 1 tablet twice a day for 2 days, then take 1 tablet once a day for the following days. 15 tablet 0  . montelukast  (SINGULAIR ) 10 mg tablet Take 10 mg by mouth at bedtime    . pantoprazole  (PROTONIX ) 40 MG DR tablet Take 40 mg by mouth once daily    . tamsulosin  (FLOMAX ) 0.4 mg capsule Take 0.4 mg by mouth once daily    . omeprazole  (PRILOSEC) 40 MG DR capsule Take 40 capsules  by mouth once daily (Patient not taking: Reported on 02/02/2024)     No current facility-administered medications on file prior to visit.   Allergies: has no known allergies.  This an established patient is here today for: office visit.  Review of Systems     Objective:   Physical Exam Vitals reviewed.  Constitutional:      General: He is not in acute distress.    Appearance: Normal appearance. He is not ill-appearing.  Pulmonary:     Effort: Respiratory distress present.  Abdominal:     Comments: TENDER LOWER ABDOMINAL WALL BELOW AND LATERAL TO UMBILICUS AND MEDIAL TO INGUINAL   RING   Genitourinary:    Penis: Normal.      Testes: Normal.     Comments: NO INGUINAL HERNIA NOTED.  Skin:    General: Skin is warm and dry.  Neurological:     Mental Status: He is alert.        Assessment:     1. Inguinal muscle strain, initial encounter   Comments: possible directed hernia Orders: -     predniSONE  (DELTASONE ) 20 MG tablet; Take 1 tablet (20 mg total) by mouth 2 (two) times daily for 5 days -     Ambulatory Referral to Urology  2. Pain in both testicles   -     Urinalysis w/Microscopic -     predniSONE  (DELTASONE ) 20 MG tablet; Take 1 tablet (20 mg total) by mouth 2 (two) times daily for 5 days -     Ambulatory Referral to Urology     Plan:  Patient Instructions  SCROTAL SUPPORT PREDNISONE  I HAVE ASKED FOR UROLOGY EVAL  YOU SHOULD GET A CALL WITH DETAILS RECOMMEND NO HEAVY LIFTING ,BENDING , PUSHING , OR PULLING.

## 2024-02-03 ENCOUNTER — Ambulatory Visit: Payer: Self-pay

## 2024-02-03 ENCOUNTER — Telehealth: Payer: Self-pay

## 2024-02-03 NOTE — Telephone Encounter (Signed)
 Patient called in stating he needs a CT scan order to show if he has a inguinal hernia or not. I explain that he needs to be seen by a provider first. I was able to schedule for testicle pain and groin strain for tomorrow with Dr. Twylla.

## 2024-02-03 NOTE — Telephone Encounter (Signed)
  FYI Only or Action Required?: Action required by provider: request for appointment and lab or test result follow-up needed.  Ct scan of abdomen and pelvic area  Patient was last seen in primary care on 01/19/2024 by Blair, Diane W, FNP.  Called Nurse Triage reporting possible Inguinal Hernia.  Symptoms began 6 weeks.  Interventions attempted: Nothing.  Symptoms are: unchanged.  Triage Disposition: Go to ED Now (Notify PCP) Pt went to [facility] and was advised to see PCP for CT scan, and referral to surgeon.  Patient/caregiver understands and will follow disposition?: Yes               pt said he needs ct scan done and want a nurse to call him he also stated he have a growing hernia. Please follow up with pt anytime before 9:30 or after 10:00    Reason for Disposition  Hernia is painful or tender to touch  Answer Assessment - Initial Assessment Questions Pt states he injured himself 6 weeks ago and now has pain on right side. Seen at [facility] clinic and told he has a hernia.  No obvious bulge, but painful. Pt is requesting that provider order a CT of lower abdomen and pelvic area.  No appts availble. Please advise     1. ONSET:  When did this first appear?     5 weeks 2. APPEARANCE: What does it look like?     Right side 3. SIZE: How big is it? (e.g., inches, cm; or compare to coins, fruit)     unsure 4. LOCATION: Where exactly is the hernia located?     Right side at  5. PATTERN: Does the swelling come and go, or has it been constant since it started?     Comes and goes 6. PAIN: Is there any pain? If Yes, ask: How bad is it?  (Scale 0-10; or none, mild, moderate, severe)     yes 7. DIAGNOSIS: Have you been seen by a doctor (or NP/PA) for this? Did the doctor diagnose you as having a hernia?     Seen at [facility] clinic 8. OTHER SYMPTOMS: Do you have any other symptoms? (e.g., fever, abdomen pain, vomiting)     pain  Protocols used:  Hernia-A-AH

## 2024-02-03 NOTE — Telephone Encounter (Signed)
 appt

## 2024-02-04 ENCOUNTER — Ambulatory Visit (INDEPENDENT_AMBULATORY_CARE_PROVIDER_SITE_OTHER): Admitting: Urology

## 2024-02-04 ENCOUNTER — Other Ambulatory Visit: Payer: Self-pay | Admitting: Urology

## 2024-02-04 ENCOUNTER — Telehealth: Payer: Self-pay

## 2024-02-04 ENCOUNTER — Encounter: Payer: Self-pay | Admitting: Urology

## 2024-02-04 VITALS — BP 148/103 | HR 84 | Ht 69.0 in | Wt 185.0 lb

## 2024-02-04 DIAGNOSIS — R1031 Right lower quadrant pain: Secondary | ICD-10-CM | POA: Diagnosis not present

## 2024-02-04 DIAGNOSIS — R1032 Left lower quadrant pain: Secondary | ICD-10-CM | POA: Diagnosis not present

## 2024-02-04 DIAGNOSIS — R1030 Lower abdominal pain, unspecified: Secondary | ICD-10-CM | POA: Diagnosis not present

## 2024-02-04 MED ORDER — MELOXICAM 15 MG PO TABS: 15.0000 mg | ORAL_TABLET | Freq: Every day | ORAL | 0 refills | Status: DC

## 2024-02-04 NOTE — Patient Instructions (Signed)
 Scheduling number: [phone]

## 2024-02-04 NOTE — Telephone Encounter (Signed)
 Appt scheduled for 8/22 but may call back and cancel and his issue gets resolved at his Uro appt today.

## 2024-02-04 NOTE — Telephone Encounter (Signed)
 Pt states he was seen today and [facility] was to call in pain meds to [facility] [address].   He does not have a rx at [facility].  Pt aware [facility] was seeing pts all day today. Will send to [facility] for update.   Pls advise.

## 2024-02-04 NOTE — Progress Notes (Signed)
 02/04/2024 5:16 PM   Andres Dillon 1960/08/11 [id number]  Referring provider: Vicci Duwaine SQUIBB, DO [address] [place],  [place] [address]  Chief Complaint  Patient presents with   Groin Pain    HPI: Andres Dillon is a 63 y.o. male referred for evaluation of groin and scrotal pain.  States ~5 weeks ago he was doing heavy lifting and that evening was complaining of significant right inguinal pain and the next day developed bilateral scrotal discomfort which persisted. No bothersome LUTS Was followed by Dr. Penne for BPH, elevated PSA and erectile dysfunction Seen [facility] walk-in 02/02/2024.  Urinalysis was unremarkable.  Noted to have a normal GU exam with tenderness lower abdominal wall. He was started on prednisone  and urology referral was placed He thinks he may have a hernia but has not noted a bulge.  Taking ibuprofen  and acetaminophen He has been in contact with Dr. Rodolph on who he states recommended a pelvic CT and general surgery referral Presently without significant scrotal pain   PMH: Past Medical History:  Diagnosis Date   Arthritis    GERD (gastroesophageal reflux disease)     Surgical History: Past Surgical History:  Procedure Laterality Date   COLONOSCOPY  2014   SPINE SURGERY  12/23/1995   fell/ rods and screws T10 /L2 fusions    Home Medications:  Allergies as of 02/04/2024   No Known Allergies      Medication List        Accurate as of February 04, 2024  5:16 PM. If you have any questions, ask your nurse or doctor.          amoxicillin -clavulanate 875-125 MG tablet Commonly known as: AUGMENTIN  Take 1 tablet by mouth 2 (two) times daily.   fluticasone  50 MCG/ACT nasal spray Commonly known as: FLONASE  Place 2 sprays into both nostrils 2 (two) times daily.   montelukast  10 MG tablet Commonly known as: SINGULAIR  Take 10 mg by mouth daily.   pantoprazole  40 MG tablet Commonly known as: PROTONIX  Take 1 tablet (40 mg total) by  mouth daily.   tadalafil  20 MG tablet Commonly known as: CIALIS  Take 1 tablet (20 mg total) by mouth daily as needed for erectile dysfunction.   tamsulosin  0.4 MG Caps capsule Commonly known as: FLOMAX  TAKE 1 CAPSULE BY MOUTH EVERY DAY        Allergies: No Known Allergies  Family History: Family History  Problem Relation Age of Onset   Cancer Mother    Cancer Father    Heart disease Father 79       CABG    Kidney disease Father     Social History:  reports that he has never smoked. He has never used smokeless tobacco. He reports current alcohol use of about 12.0 standard drinks of alcohol per week. He reports that he does not use drugs.   Physical Exam: BP (!) 148/103   Pulse 84   Ht 5' 9 (1.753 m)   Wt 185 lb (83.9 kg)   BMI 27.32 kg/m   Constitutional:  Alert and oriented, No acute distress. HEENT: Andres Dillon AT GI: Abdomen is soft; tender inguinal canal no definite hernia appreciated however unable to get an adequate exam secondary to tenderness GU: Testes descended bilaterally without masses or tenderness Psychiatric: Normal mood and affect.   Assessment & Plan:    1. Lower abdominal pain Lower abdominal/inguinal pain onset after heavy lifting Unable to get an adequate exam today secondary to tenderness  We discussed potential etiologies including hernia and musculoskeletal pain Discontinue ibuprofen  and Rx meloxicam  sent to pharmacy CT pelvis ordered General Surgery referral to Dr. Rodolph was placed    Andres JAYSON Barba, MD  [facility] [address] [place], [place] [address] [phone]

## 2024-02-04 NOTE — Telephone Encounter (Signed)
 yes

## 2024-02-06 ENCOUNTER — Ambulatory Visit
Admission: RE | Admit: 2024-02-06 | Discharge: 2024-02-06 | Disposition: A | Discharge status: Home / Self Care | Source: Ambulatory Visit | Attending: Urology | Admitting: Urology

## 2024-02-06 DIAGNOSIS — R1031 Right lower quadrant pain: Secondary | ICD-10-CM | POA: Diagnosis not present

## 2024-02-06 DIAGNOSIS — R1030 Lower abdominal pain, unspecified: Secondary | ICD-10-CM | POA: Diagnosis not present

## 2024-02-06 DIAGNOSIS — K5900 Constipation, unspecified: Secondary | ICD-10-CM | POA: Diagnosis not present

## 2024-02-06 MED ORDER — IOHEXOL 300 MG/ML  SOLN
100.0000 mL | Freq: Once | INTRAMUSCULAR | Status: AC | PRN
  Administered 2024-02-06: 100 mL via INTRAVENOUS

## 2024-02-08 ENCOUNTER — Ambulatory Visit: Payer: Self-pay | Admitting: Urology

## 2024-02-11 ENCOUNTER — Ambulatory Visit: Payer: Self-pay

## 2024-02-11 NOTE — Telephone Encounter (Signed)
 FYI Only or Action Required?: Action required by provider: request for MRI.  Patient was last seen in primary care on 01/19/2024 by Blair, Diane W, FNP.  Called Nurse Triage reporting Abdominal Pain.  Symptoms began ongoing.  Interventions attempted: OTC medications: tylenol and ibuprofen .  Symptoms are: gradually worsening.  Triage Disposition: See Physician Within 24 Hours  Patient/caregiver understands and will follow disposition?: No, wishes to speak with PCP   Copied from CRM #[id number]. Topic: Clinical - Red Word Triage >> Feb 11, 2024 10:21 AM Deaijah H wrote: Red Word that prompted transfer to Nurse Triage: Pelvic pain & stomach pain .SABRA First of week wasn't able to have bowel movements. Reason for Disposition  [1] Few streaks of blood in vomit AND [2] occurred one time  [1] MODERATE pain (e.g., interferes with normal activities) AND [2] pain comes and goes (cramps) AND [3] present > 24 hours  (Exception: Pain with Vomiting or Diarrhea - see that Guideline.)  Answer Assessment - Initial Assessment Questions 1. LOCATION: Where does it hurt?      Left side 2. RADIATION: Does the pain shoot anywhere else? (e.g., chest, back)     no 3. ONSET: When did the pain begin? (Minutes, hours or days ago)      X 1 week 4. SUDDEN: Gradual or sudden onset?     sudden 5. PATTERN Does the pain come and go, or is it constant?     constant 6. SEVERITY: How bad is the pain?  (e.g., Scale 1-10; mild, moderate, or severe)     severe 7. RECURRENT SYMPTOM: Have you ever had this type of stomach pain before? If Yes, ask: When was the last time? and What happened that time?      no 8. CAUSE: What do you think is causing the stomach pain? (e.g., gallstones, recent abdominal surgery)     unknown 9. RELIEVING/AGGRAVATING FACTORS: What makes it better or worse? (e.g., antacids, bending or twisting motion, bowel movement)     mo 10. OTHER SYMPTOMS: Do you have any other  symptoms? (e.g., back pain, diarrhea, fever, urination pain, vomiting)       no  Attempted to schedule pt appt: pt refused stating requesting MRI due to increase pain.  Please have PCP office call pt back with updated plan of care.  Sometimes feels like his intestine is coming through abd wall Needs pelvic floor issues.  Pt is requesting a MRI r/t  because pt thinks he may have a partial bowel blockage.  Answer Assessment - Initial Assessment Questions 1. MECHANISM: How did the injury happen? (e.g., twisting injury, direct blow)      Pt was lifting something heavy and ended up having groin pain 2. ONSET: When did the injury happen? (e.g., minutes, hours ago)      X 6 weeks ago 3. LOCATION: Where is the injury located?      Groin  4. APPEARANCE of INJURY: What does the injury look like?  (e.g., looks normal; bruise, swelling)     na 5. PAIN: Is there pain? If Yes, ask: How bad is the pain?   What does it keep you from doing? (Scale 0-10; or none, mild, moderate, severe)     severe 6. SIZE: For cuts, bruises, or swelling, ask: How large is it? (e.g., inches or centimeters;  entire joint)      na 7. TETANUS: For any breaks in the skin, ask: When was your last tetanus booster?     na 8.  OTHER SYMPTOMS: Do you have any other symptoms?      na 9. PREGNANCY: Is there any chance you are pregnant? When was your last menstrual period?     Na  X1 week ago pt re-injured groin area .  Had CT scan last Friday: neg results  Protocols used: Groin Injury and Strain-A-AH, Abdominal Pain - Male-A-AH

## 2024-02-12 ENCOUNTER — Ambulatory Visit: Payer: Self-pay

## 2024-02-12 NOTE — Telephone Encounter (Signed)
       Copied from CRM [id number]. Topic: Clinical - Medical Advice >> Feb 12, 2024  9:16 AM Charlet HERO wrote: Reason for CRM: Patient is calling to state that he has had bowel movement and that he was having bowel blockage and could not go without taking citrus nitrate but has been doing pelvic exercise and it has worked. He was informed may need to have a scan for the issue and he wants to hold off on the scan to see how the exercise is going to work for him. Please give patient call back to discuss.

## 2024-02-12 NOTE — Telephone Encounter (Signed)
 Copied from CRM [id number]. Topic: Clinical - Medical Advice >> Feb 12, 2024  9:16 AM Charlet HERO wrote: Reason for CRM: Patient is calling to state that he has had bowel movement and that he was having bowel blockage and could not go without taking citrus nitrate but has been doing pelvic exercise and it has worked. He was informed may need to have a scan for the issue and he wants to hold off on the scan to see how the exercise is going to work for him. Please give patient call back to discuss.

## 2024-02-13 ENCOUNTER — Ambulatory Visit: Admitting: Family Medicine

## 2024-02-17 NOTE — Telephone Encounter (Signed)
 States this started 6 weeks ago the night of lifting something very heavy. Over the next 6 weeks the pain did improve some but the reason he decided to be seen with UC because after some minimal lifting while working in his garage that night he had extreme testicle pain. Since then the pain is in his groin, travels into his thigh or buttock. He feels at this point he would like an MRI to ensure the best possible imaging as the CT showed no evidence of anything.   He is aware of his appointment tomorrow.

## 2024-02-18 ENCOUNTER — Encounter: Payer: Self-pay | Admitting: Family Medicine

## 2024-02-18 ENCOUNTER — Ambulatory Visit: Admitting: Family Medicine

## 2024-02-18 VITALS — BP 153/87 | HR 85 | Temp 97.9°F | Ht 69.0 in | Wt 183.0 lb

## 2024-02-18 DIAGNOSIS — S76211D Strain of adductor muscle, fascia and tendon of right thigh, subsequent encounter: Secondary | ICD-10-CM

## 2024-02-18 MED ORDER — BACLOFEN 10 MG PO TABS: 5.0000 mg | ORAL_TABLET | Freq: Every evening | ORAL | 0 refills | Status: DC | PRN

## 2024-02-18 MED ORDER — DICLOFENAC SODIUM 75 MG PO TBEC
75.0000 mg | DELAYED_RELEASE_TABLET | Freq: Two times a day (BID) | ORAL | 1 refills | Status: DC
Start: 2024-02-18 — End: 2024-03-05

## 2024-02-18 NOTE — Progress Notes (Signed)
 BP (!) 153/87   Pulse 85   Temp 97.9 F (36.6 C) (Oral)   Ht 5' 9 (1.753 m)   Wt 183 lb (83 kg)   SpO2 98%   BMI 27.02 kg/m    Subjective:    Patient ID: Andres Dillon, male    DOB: [birth year], 63 y.o.   MRN: [id number]  HPI: Andres Dillon is a 63 y.o. male  Chief Complaint  Patient presents with   Groin Pain    Onset about a month ago. Concerns of possible tear. Heavy lifting.    Started with groin pain about a month ago. Went to UC and was diagnosed with a groin strain and sent to urology. Saw urology about 2 weeks ago who were unable to do a through exam due to the severity of his pain. They referred him to general surgery and got him a CT pelvis to r/o a hernia. CT done 8/15 was normal.   Has been using ice and ibuprofen . Over a period of about 3 weeks he has been getting better. He notes that he continued to have some pain in his groin that has never resolved. Whenever he lifts anything he is having pain. He states that meloxicam  isn't doing anything for him. Pain is in his R groin- better with rest, worst with lifting. Pain doesn't radiate. He'd like to get an MRI to look for possible tear.  He is otherwise doing well with no other concerns or complaints at this time.   Relevant past medical, surgical, family and social history reviewed and updated as indicated. Interim medical history since our last visit reviewed. Allergies and medications reviewed and updated.  Review of Systems  Constitutional: Negative.   Respiratory: Negative.    Cardiovascular: Negative.   Musculoskeletal:  Positive for myalgias. Negative for arthralgias, back pain, gait problem, joint swelling, neck pain and neck stiffness.  Skin: Negative.   Psychiatric/Behavioral: Negative.      Per HPI unless specifically indicated above     Objective:    BP (!) 153/87   Pulse 85   Temp 97.9 F (36.6 C) (Oral)   Ht 5' 9 (1.753 m)   Wt 183 lb (83 kg)   SpO2 98%   BMI 27.02 kg/m   Wt Readings  from Last 3 Encounters:  02/18/24 183 lb (83 kg)  02/04/24 185 lb (83.9 kg)  09/02/23 183 lb 6 oz (83.2 kg)    Physical Exam Vitals and nursing note reviewed.  Constitutional:      General: He is not in acute distress.    Appearance: Normal appearance. He is not ill-appearing, toxic-appearing or diaphoretic.  HENT:     Head: Normocephalic and atraumatic.     Right Ear: External ear normal.     Left Ear: External ear normal.     Nose: Nose normal.     Mouth/Throat:     Mouth: Mucous membranes are moist.     Pharynx: Oropharynx is clear.  Eyes:     General: No scleral icterus.       Right eye: No discharge.        Left eye: No discharge.     Extraocular Movements: Extraocular movements intact.     Conjunctiva/sclera: Conjunctivae normal.     Pupils: Pupils are equal, round, and reactive to light.  Cardiovascular:     Rate and Rhythm: Normal rate and regular rhythm.     Pulses: Normal pulses.     Heart sounds: Normal  heart sounds. No murmur heard.    No friction rub. No gallop.  Pulmonary:     Effort: Pulmonary effort is normal. No respiratory distress.     Breath sounds: Normal breath sounds. No stridor. No wheezing, rhonchi or rales.  Chest:     Chest wall: No tenderness.  Musculoskeletal:        General: Normal range of motion.     Cervical back: Normal range of motion and neck supple.  Skin:    General: Skin is warm and dry.     Capillary Refill: Capillary refill takes less than 2 seconds.     Coloration: Skin is not jaundiced or pale.     Findings: No bruising, erythema, lesion or rash.  Neurological:     General: No focal deficit present.     Mental Status: He is alert and oriented to person, place, and time. Mental status is at baseline.  Psychiatric:        Mood and Affect: Mood normal.        Behavior: Behavior normal.        Thought Content: Thought content normal.        Judgment: Judgment normal.     Results for orders placed or performed in visit on  09/15/23  Testosterone    Collection Time: 09/15/23  8:19 AM  Result Value Ref Range   Testosterone  365 264 - 916 ng/dL      Assessment & Plan:   Problem List Items Addressed This Visit   None Visit Diagnoses       Inguinal strain, right, subsequent encounter    -  Primary   Will change his meloxicam  to diclofenac  and start baclofen . Discussed PT- would like to see sports medicine. Appointment with them scheduled. Call w/ concerns.   Relevant Orders   Ambulatory referral to Sports Medicine        Follow up plan: Return for As scheduled.

## 2024-02-20 ENCOUNTER — Encounter: Payer: Self-pay | Admitting: Family Medicine

## 2024-02-20 ENCOUNTER — Ambulatory Visit: Admitting: Family Medicine

## 2024-02-20 ENCOUNTER — Encounter: Admitting: Family Medicine

## 2024-02-20 ENCOUNTER — Other Ambulatory Visit (INDEPENDENT_AMBULATORY_CARE_PROVIDER_SITE_OTHER): Payer: Self-pay | Admitting: Radiology

## 2024-02-20 VITALS — BP 130/100 | HR 94 | Temp 98.1°F | Ht 69.0 in | Wt 183.0 lb

## 2024-02-20 DIAGNOSIS — M1612 Unilateral primary osteoarthritis, left hip: Secondary | ICD-10-CM

## 2024-02-20 DIAGNOSIS — M1611 Unilateral primary osteoarthritis, right hip: Secondary | ICD-10-CM | POA: Diagnosis not present

## 2024-02-20 DIAGNOSIS — S76211A Strain of adductor muscle, fascia and tendon of right thigh, initial encounter: Secondary | ICD-10-CM | POA: Diagnosis not present

## 2024-02-20 DIAGNOSIS — M160 Bilateral primary osteoarthritis of hip: Secondary | ICD-10-CM | POA: Insufficient documentation

## 2024-02-20 MED ORDER — TRIAMCINOLONE ACETONIDE 40 MG/ML IJ SUSP
40.0000 mg | Freq: Once | INTRAMUSCULAR | Status: AC
Start: 2024-02-20 — End: 2024-02-20
  Administered 2024-02-20: 40 mg via INTRAMUSCULAR

## 2024-02-20 MED ORDER — CYCLOBENZAPRINE HCL 5 MG PO TABS
5.0000 mg | ORAL_TABLET | Freq: Three times a day (TID) | ORAL | 0 refills | Status: DC | PRN
Start: 2024-02-20 — End: 2024-03-15

## 2024-02-20 NOTE — Patient Instructions (Signed)
 Patient Plan for Post-Visit Guidance  Right Adductor Muscle/Tendon Strain and Hip Osteoarthritis - Continue diclofenac  75 mg twice daily with food until follow-up. - Take cyclobenzaprine  5-10 mg up to three times a day, aiming for consistent nighttime use; use additional doses as needed for pain. - Rest and limit strenuous activity; light activities such as riding a lawnmower are allowed. - Begin home-based exercises as provided; advance gradually and do not push through pain. - If symptoms are significantly improved at or after two weeks, contact the office for next steps. - Return for follow-up in 4 weeks.  Left Hip Osteoarthritis - Continue current management as discussed.  Red Flags - If you experience severe or sudden groin, hip, or leg pain, inability to walk, new swelling, redness, fever, loss of bladder or bowel control, or any new or worsening symptoms, seek medical attention promptly.

## 2024-02-20 NOTE — Assessment & Plan Note (Signed)
 See additional assessment(s) for plan details.

## 2024-02-20 NOTE — Progress Notes (Signed)
 Primary Care / Sports Medicine Office Visit  Patient Information:  Patient ID: Andres Dillon, male DOB: Jun 04, 1961 Age: 63 y.o. MRN: [id number]   Andres Dillon is a pleasant 63 y.o. male presenting with the following:  Chief Complaint  Patient presents with   Groin Pain    Pain in bil groin area x 7 weeks ago, Thought it was getting better with ibu , tylenol and ice. Patient realized he was not getting better so he went to [facility] walk in was referred to Urology, CT ordered negative for hernia, remarkable impression, taking meloxicam . Seen PCP given more antiinflammatory. Pain is radiating from bil groin in to thighs. Patient has HX of lumbar fusion.    Vitals:   02/20/24 0926  BP: (!) 130/100  Pulse: 94  Temp: 98.1 F (36.7 C)  SpO2: 98%   Vitals:   02/20/24 0926  Weight: 183 lb (83 kg)  Height: 5' 9 (1.753 m)   Body mass index is 27.02 kg/m.  CT PELVIS W CONTRAST Result Date: 02/06/2024 CLINICAL DATA:  Right groin pain after lifting heavy object 5 weeks ago. EXAM: CT PELVIS WITH CONTRAST TECHNIQUE: Multidetector CT imaging of the pelvis was performed using the standard protocol following the bolus administration of intravenous contrast. RADIATION DOSE REDUCTION: This exam was performed according to the departmental dose-optimization program which includes automated exposure control, adjustment of the mA and/or kV according to patient size and/or use of iterative reconstruction technique. CONTRAST:  OMNIPAQUE  IOHEXOL  300 MG/ML  SOLN COMPARISON:  None Available. FINDINGS: Urinary Tract:  Urinary bladder is unremarkable. Bowel:  Unremarkable visualized pelvic bowel loops. Vascular/Lymphatic: No pathologically enlarged lymph nodes. No significant vascular abnormality seen. Reproductive:  Prostate is unremarkable. Other:  No ascites or hernia is noted. Musculoskeletal: No suspicious bone lesions identified. IMPRESSION: No definite abnormality seen in the pelvis.  Electronically Signed   By: Lynwood Landy Raddle M.D.   On: 02/06/2024 08:47     Independent interpretation of notes and tests performed by another provider:   Independent interpretation of CT pelvis with contrast from 02/06/2024 [place] right greater than left femoroacetabular hip osteoarthritis with femoral head deformation, osteophyte formation at the acetabulum and joint space loss to focally, right side moderate to severe, left mild to moderate.  Procedures performed:   Procedure:  Injection of right hip under ultrasound guidance. Ultrasound guidance utilized for in-plane approach to the right femoral head-neck junction, sonographic evidence of injectate noted Samsung HS60 device utilized with permanent recording / reporting. Verbal informed consent obtained and verified. Skin prepped in a sterile fashion. Ethyl chloride for topical local analgesia.  Completed without difficulty and tolerated well. Medication: triamcinolone  acetonide 40 mg/mL suspension for injection 1 mL total and 2 mL lidocaine 1% without epinephrine utilized for needle placement anesthetic Advised to contact for fevers/chills, erythema, induration, drainage, or persistent bleeding.   Pertinent History, Exam, Impression, and Recommendations:   Problem List Items Addressed This Visit     Primary osteoarthritis of left hip   See additional assessment(s) for plan details.      Relevant Medications   cyclobenzaprine  (FLEXERIL ) 5 MG tablet   Primary osteoarthritis of right hip   Right hip osteoarthritis Chronic right hip osteoarthritis with significant arthritis on CT scan. Symptoms exacerbated by recent physical activity. Differential includes hip joint arthritis and adductor tendon strain. Ultrasound-guided diagnostic and therapeutic injection into the right hip joint with lidocaine, bupivacaine, and steroid with minimal response. Points towards adductor strain as primary  etiology. - Advise rest and limited activity  over the weekend, allowing for light activities such as riding a lawnmower.      Relevant Medications   cyclobenzaprine  (FLEXERIL ) 5 MG tablet   Other Relevant Orders   US  LIMITED JOINT SPACE STRUCTURES LOW RIGHT   Strain of adductor magnus muscle of right lower extremity - Primary   History of Present Illness Andres Dillon is a 63 year old male who presents with right-sided groin pain following heavy lifting.  Right groin and testicular pain - Onset approximately seven weeks ago after lifting heavy equipment while assembling a car lift - No immediate pain at the time of injury; significant groin pain developed later that night - Initial self-treatment with isopropyl alcohol; pain improved slightly over the following weeks - Subsequent episode of severe testicular pain while working in the garage, described as 'lightning bolts' in the groin, particularly at night - Pain intensity was severe enough to consider emergency care - Testicular pain improved over time, but persistent groin discomfort remains - Pain is more pronounced when relaxing and alleviated by placing a pillow between the legs while sleeping - Pain radiates to the buttock and upper leg, but does not extend below the knee  Physical examination findings and symptom exacerbation - Right side more painful than left - Exacerbation of pain with Valsalva maneuver (pushing as if having a bowel movement) - Sensation of a 'little bulge' in the right groin area  Prior evaluations and imaging - Evaluated at a walk-in clinic for possible hernia - Referred to urology; CT scan performed with no significant findings related to symptoms  Symptom management and response to treatment - Diclofenac  anti-inflammatory medication provides some relief  Relevant surgical history - History of hip surgery with bone graft harvested from the hip for a spine fusion  Physical Exam BILATERAL HIPS INSPECTION: No visible deformity, swelling, or  erythema PALPATION: Right hip - tenderness throughout the adductor magnus proximally and at the adductor tendon; piriformis region with equivocal tenderness. Left hip - adductor magnus and piriformis region nontender RANGE OF MOTION: Right hip - full but limited by pain with provocative maneuvers. Left hip - full and symmetric STRENGTH: Right hip - resisted hip flexion equivocal with mild discomfort; otherwise 5/5 strength preserved. Left hip - resisted hip flexion benign with 5/5 strength SPECIAL TESTS: Right hip - FADIR test positive, FABER test negative, piriformis test equivocal to positive. Left hip - FADIR test equivocal, FABER test negative, piriformis test negative  HERNIA / GROIN INSPECTION: No visible bulge or asymmetry in the inguinal, femoral, or perineal regions at rest PALPATION: No palpable inguinal or femoral hernia defects; Valsalva maneuver did not reproduce a bulge or impulse bilaterally ADDITIONAL FINDINGS: Incidental 2x2 cm flesh-colored skin protrusion noted in the perineal region, soft, mobile, nontender, without erythema, drainage, or irritation, consistent with benign skin change  Right adductor muscle/tendon strain Right adductor muscle/tendon strain primary concern based on tenderness, symptomatology, and limited response from diagnostic/therapeutic right hip intra-articular injection. Differential includes inguinal hernia and hip joint arthritis. -Continue diclofenac  75 mg twice daily with food until follow-up -Start cyclobenzaprine , 5-10 mg 3 times a day, aim for consistent nighttime dosing with other doses being reserved for as needed pain -Limit strenuous activity until return -Start home-based exercises with information provided via printout and MyChart, focus on gradual advance, do not push through pain -If symptoms significantly improved at the 2-week mark or beyond, contact our office for interim steps -Return for follow-up in 4  weeks      Relevant Medications    cyclobenzaprine  (FLEXERIL ) 5 MG tablet     Orders & Medications Medications:  Meds ordered this encounter  Medications   triamcinolone  acetonide (KENALOG -40) injection 40 mg   cyclobenzaprine  (FLEXERIL ) 5 MG tablet    Sig: Take 1-2 tablets (5-10 mg total) by mouth 3 (three) times daily as needed.    Dispense:  90 tablet    Refill:  0   Orders Placed This Encounter  Procedures   US  LIMITED JOINT SPACE STRUCTURES LOW RIGHT     No follow-ups on file.     Selinda JINNY Ku, MD, [facility]   Primary Care Sports Medicine Primary Care and Sports Medicine at [facility]

## 2024-02-20 NOTE — Assessment & Plan Note (Signed)
 Right hip osteoarthritis Chronic right hip osteoarthritis with significant arthritis on CT scan. Symptoms exacerbated by recent physical activity. Differential includes hip joint arthritis and adductor tendon strain. Ultrasound-guided diagnostic and therapeutic injection into the right hip joint with lidocaine, bupivacaine, and steroid with minimal response. Points towards adductor strain as primary etiology. - Advise rest and limited activity over the weekend, allowing for light activities such as riding a lawnmower.

## 2024-02-20 NOTE — Assessment & Plan Note (Addendum)
 History of Present Illness Andres Dillon is a 63 year old male who presents with right-sided groin pain following heavy lifting.  Right groin and testicular pain - Onset approximately seven weeks ago after lifting heavy equipment while assembling a car lift - No immediate pain at the time of injury; significant groin pain developed later that night - Initial self-treatment with isopropyl alcohol; pain improved slightly over the following weeks - Subsequent episode of severe testicular pain while working in the garage, described as 'lightning bolts' in the groin, particularly at night - Pain intensity was severe enough to consider emergency care - Testicular pain improved over time, but persistent groin discomfort remains - Pain is more pronounced when relaxing and alleviated by placing a pillow between the legs while sleeping - Pain radiates to the buttock and upper leg, but does not extend below the knee  Physical examination findings and symptom exacerbation - Right side more painful than left - Exacerbation of pain with Valsalva maneuver (pushing as if having a bowel movement) - Sensation of a 'little bulge' in the right groin area  Prior evaluations and imaging - Evaluated at a walk-in clinic for possible hernia - Referred to urology; CT scan performed with no significant findings related to symptoms  Symptom management and response to treatment - Diclofenac  anti-inflammatory medication provides some relief  Relevant surgical history - History of hip surgery with bone graft harvested from the hip for a spine fusion  Physical Exam BILATERAL HIPS INSPECTION: No visible deformity, swelling, or erythema PALPATION: Right hip - tenderness throughout the adductor magnus proximally and at the adductor tendon; piriformis region with equivocal tenderness. Left hip - adductor magnus and piriformis region nontender RANGE OF MOTION: Right hip - full but limited by pain with provocative  maneuvers. Left hip - full and symmetric STRENGTH: Right hip - resisted hip flexion equivocal with mild discomfort; otherwise 5/5 strength preserved. Left hip - resisted hip flexion benign with 5/5 strength SPECIAL TESTS: Right hip - FADIR test positive, FABER test negative, piriformis test equivocal to positive. Left hip - FADIR test equivocal, FABER test negative, piriformis test negative  HERNIA / GROIN INSPECTION: No visible bulge or asymmetry in the inguinal, femoral, or perineal regions at rest PALPATION: No palpable inguinal or femoral hernia defects; Valsalva maneuver did not reproduce a bulge or impulse bilaterally ADDITIONAL FINDINGS: Incidental 2x2 cm flesh-colored skin protrusion noted in the perineal region, soft, mobile, nontender, without erythema, drainage, or irritation, consistent with benign skin change  Right adductor muscle/tendon strain Right adductor muscle/tendon strain primary concern based on tenderness, symptomatology, and limited response from diagnostic/therapeutic right hip intra-articular injection. Differential includes inguinal hernia and hip joint arthritis. -Continue diclofenac  75 mg twice daily with food until follow-up -Start cyclobenzaprine , 5-10 mg 3 times a day, aim for consistent nighttime dosing with other doses being reserved for as needed pain -Limit strenuous activity until return -Start home-based exercises with information provided via printout and MyChart, focus on gradual advance, do not push through pain -If symptoms significantly improved at the 2-week mark or beyond, contact our office for interim steps -Return for follow-up in 4 weeks

## 2024-03-03 ENCOUNTER — Other Ambulatory Visit

## 2024-03-03 DIAGNOSIS — N401 Enlarged prostate with lower urinary tract symptoms: Secondary | ICD-10-CM

## 2024-03-03 DIAGNOSIS — R3912 Poor urinary stream: Secondary | ICD-10-CM | POA: Diagnosis not present

## 2024-03-04 ENCOUNTER — Encounter: Payer: Self-pay | Admitting: Family Medicine

## 2024-03-04 ENCOUNTER — Telehealth: Payer: Self-pay

## 2024-03-04 ENCOUNTER — Ambulatory Visit: Payer: Self-pay | Admitting: Urology

## 2024-03-04 ENCOUNTER — Ambulatory Visit: Payer: Self-pay

## 2024-03-04 ENCOUNTER — Ambulatory Visit: Admitting: Family Medicine

## 2024-03-04 VITALS — BP 150/92 | HR 87 | Ht 69.0 in | Wt 187.0 lb

## 2024-03-04 DIAGNOSIS — M1611 Unilateral primary osteoarthritis, right hip: Secondary | ICD-10-CM

## 2024-03-04 LAB — PSA: Prostate Specific Ag, Serum: 8.1 ng/mL — ABNORMAL HIGH (ref 0.0–4.0)

## 2024-03-04 NOTE — Telephone Encounter (Signed)
 Copied from CRM [id number]. Topic: Clinical - Lab/Test Results >> Mar 04, 2024  8:17 AM Andres Dillon wrote: Reason for CRM: Patient called in regarding his lab test results.  Per patient his PSA has doubled and he is very concerned per patient he is requesting to speak to MD.

## 2024-03-04 NOTE — Telephone Encounter (Signed)
 This appears to have been ordered by urology. I will look into it

## 2024-03-04 NOTE — Telephone Encounter (Signed)
 Noted  Pt has appt.  KP

## 2024-03-04 NOTE — Telephone Encounter (Signed)
 We did not do this lab on patient. He only see's our office for sports medicine, not primary care or Urology. He needs to contact his PCP or Urologist about the labs.  I have also sent this to his PCP to inform her he is calling.  Thanks!

## 2024-03-04 NOTE — Telephone Encounter (Signed)
 Copied from CRM [id number]. Topic: Clinical - Lab/Test Results >> Mar 04, 2024  8:17 AM Berwyn MATSU wrote: Reason for CRM: Patient called in regarding his lab test results.  Per patient his PSA has doubled and he is very concerned per patient he is requesting to speak to MD.

## 2024-03-04 NOTE — Telephone Encounter (Signed)
 FYI Only or Action Required?: FYI only for provider.  Patient was last seen in primary care on 02/20/2024 by Alvia Selinda PARAS, MD.  Called Nurse Triage reporting Groin Pain.  Symptoms began several months ago.  Interventions attempted: Rest, hydration, or home remedies and Ice/heat application.  Symptoms are: gradually worsening.  Triage Disposition: See Physician Within 24 Hours  Patient/caregiver understands and will follow disposition?: Yes  Copied from CRM #[id number]. Topic: Clinical - Red Word Triage >> Mar 04, 2024  8:19 AM Berwyn MATSU wrote: Red Word that prompted transfer to Nurse Triage: pain in groin area Reason for Disposition  [1] MILD to MODERATE pain AND [2] comes and goes (intermittent; brief episodes less than one hour long) AND [3] present > 24 hours  Answer Assessment - Initial Assessment Questions 1. LOCATION and RADIATION: Where is the pain located?      Groin pain, Radiates down the Right Leg  2. QUALITY: What does the pain feel like?  (e.g., sharp, dull, aching, burning)     Dull  3. SEVERITY: How bad is the pain?  (Scale 1-10; or mild, moderate, severe)     Mild to Moderate  4. ONSET: When did the pain start?     Since July 12 th  5. PATTERN: Does it come and go, or has it been constant since it started?     Intermittent  6. SCROTAL APPEARANCE: What does the scrotum look like? Is there any swelling or redness?      Some Swelling, Right side  7. HERNIA: Has a doctor ever told you that you have a hernia?     Yes  8. OTHER SYMPTOMS: Do you have any other symptoms? (e.g., abdomen pain, difficulty passing urine, fever, vomiting)     Denies any other symptoms  Protocols used: Scrotum Pain-A-AH

## 2024-03-05 ENCOUNTER — Other Ambulatory Visit: Payer: Self-pay | Admitting: Family Medicine

## 2024-03-05 ENCOUNTER — Other Ambulatory Visit: Payer: Self-pay | Admitting: *Deleted

## 2024-03-05 DIAGNOSIS — M1611 Unilateral primary osteoarthritis, right hip: Secondary | ICD-10-CM

## 2024-03-05 DIAGNOSIS — R972 Elevated prostate specific antigen [PSA]: Secondary | ICD-10-CM

## 2024-03-05 DIAGNOSIS — S76211A Strain of adductor muscle, fascia and tendon of right thigh, initial encounter: Secondary | ICD-10-CM

## 2024-03-05 MED ORDER — HYDROCODONE-ACETAMINOPHEN 5-325 MG PO TABS: 1.0000 | ORAL_TABLET | Freq: Three times a day (TID) | ORAL | 0 refills | Status: DC | PRN

## 2024-03-05 MED ORDER — DICLOFENAC SODIUM 75 MG PO TBEC
75.0000 mg | DELAYED_RELEASE_TABLET | Freq: Two times a day (BID) | ORAL | 0 refills | Status: DC
Start: 2024-03-05 — End: 2024-03-23

## 2024-03-05 NOTE — Progress Notes (Signed)
 Called and spoke to both patient and radiology. Per radiology, attempting to combine the two imaging studies (MR Prostate and MR hip without contrast) would not be ideal and may result in suboptimal results for both. Technically possible but practically unfeasible.  I relayed this to patient and he vocalized understanding.  I placed a separate MRI right hip order and he is aware of the MRI Prostate study scheduled for 9/19.  Just FYI following up on our prior conversation.  Kind regards, Selinda

## 2024-03-05 NOTE — Telephone Encounter (Signed)
 Responded to by urology

## 2024-03-05 NOTE — Telephone Encounter (Signed)
 Copied from CRM [id number]. Topic: Clinical - Medication Question >> Mar 04, 2024  4:16 PM Leonette P wrote: Reason for CRM: pt called saying he as in the office to see Selinda Ku and thought he was going get pan medication. Also he needs to see a urologist for his elevated PSA.  Please call patient back [phone]

## 2024-03-09 ENCOUNTER — Other Ambulatory Visit: Payer: Self-pay

## 2024-03-09 ENCOUNTER — Emergency Department

## 2024-03-09 ENCOUNTER — Emergency Department: Admission: EM | Admit: 2024-03-09 | Discharge: 2024-03-09 | Disposition: A | Discharge status: Home / Self Care

## 2024-03-09 DIAGNOSIS — K5900 Constipation, unspecified: Secondary | ICD-10-CM | POA: Diagnosis not present

## 2024-03-09 DIAGNOSIS — N5082 Scrotal pain: Secondary | ICD-10-CM | POA: Diagnosis not present

## 2024-03-09 DIAGNOSIS — M25551 Pain in right hip: Secondary | ICD-10-CM | POA: Insufficient documentation

## 2024-03-09 DIAGNOSIS — R1031 Right lower quadrant pain: Secondary | ICD-10-CM | POA: Diagnosis not present

## 2024-03-09 DIAGNOSIS — N433 Hydrocele, unspecified: Secondary | ICD-10-CM | POA: Diagnosis not present

## 2024-03-09 DIAGNOSIS — E871 Hypo-osmolality and hyponatremia: Secondary | ICD-10-CM | POA: Insufficient documentation

## 2024-03-09 DIAGNOSIS — I861 Scrotal varices: Secondary | ICD-10-CM | POA: Diagnosis not present

## 2024-03-09 LAB — BASIC METABOLIC PANEL WITH GFR
Anion gap: 13 (ref 5–15)
Anion gap: 11 (ref 5–15)
BUN: 9 mg/dL (ref 8–23)
BUN: 8 mg/dL (ref 8–23)
CO2: 19 mmol/L — ABNORMAL LOW (ref 22–32)
CO2: 22 mmol/L (ref 22–32)
Calcium: 8.3 mg/dL — ABNORMAL LOW (ref 8.9–10.3)
Calcium: 8.1 mg/dL — ABNORMAL LOW (ref 8.9–10.3)
Chloride: 93 mmol/L — ABNORMAL LOW (ref 98–111)
Chloride: 96 mmol/L — ABNORMAL LOW (ref 98–111)
Creatinine, Ser: 0.75 mg/dL (ref 0.61–1.24)
Creatinine, Ser: 0.75 mg/dL (ref 0.61–1.24)
GFR, Estimated: >60 mL/min (ref >60)
GFR, Estimated: >60 mL/min (ref >60)
Glucose, Bld: 108 mg/dL — ABNORMAL HIGH (ref 70–99)
Glucose, Bld: 100 mg/dL — ABNORMAL HIGH (ref 70–99)
Potassium: 3.4 mmol/L — ABNORMAL LOW (ref 3.5–5.1)
Potassium: 3.6 mmol/L (ref 3.5–5.1)
Sodium: 125 mmol/L — ABNORMAL LOW (ref 135–145)
Sodium: 129 mmol/L — ABNORMAL LOW (ref 135–145)

## 2024-03-09 LAB — CBC WITH DIFFERENTIAL/PLATELET
Abs Immature Granulocytes: 0.02 K/uL (ref 0.00–0.07)
Basophils Absolute: 0 K/uL (ref 0.0–0.1)
Basophils Relative: 1 %
Eosinophils Absolute: 0.1 K/uL (ref 0.0–0.5)
Eosinophils Relative: 2 %
HCT: 35.6 % — ABNORMAL LOW (ref 39.0–52.0)
Hemoglobin: 12.9 g/dL — ABNORMAL LOW (ref 13.0–17.0)
Immature Granulocytes: 0 %
Lymphocytes Relative: 23 %
Lymphs Abs: 1.3 K/uL (ref 0.7–4.0)
MCH: 31 pg (ref 26.0–34.0)
MCHC: 36.2 g/dL — ABNORMAL HIGH (ref 30.0–36.0)
MCV: 85.6 fL (ref 80.0–100.0)
Monocytes Absolute: 0.5 K/uL (ref 0.1–1.0)
Monocytes Relative: 10 %
Neutro Abs: 3.6 K/uL (ref 1.7–7.7)
Neutrophils Relative %: 64 %
Platelets: 205 K/uL (ref 150–400)
RBC: 4.16 MIL/uL — ABNORMAL LOW (ref 4.22–5.81)
RDW: 13.5 % (ref 11.5–15.5)
WBC: 5.5 K/uL (ref 4.0–10.5)
nRBC: 0 % (ref 0.0–0.2)

## 2024-03-09 LAB — URINALYSIS, ROUTINE W REFLEX MICROSCOPIC
Bilirubin Urine: NEGATIVE
Glucose, UA: NEGATIVE mg/dL
Hgb urine dipstick: NEGATIVE
Ketones, ur: NEGATIVE mg/dL
Leukocytes,Ua: NEGATIVE
Nitrite: NEGATIVE
Protein, ur: NEGATIVE mg/dL
Specific Gravity, Urine: 1.004 — ABNORMAL LOW (ref 1.005–1.030)
pH: 6 (ref 5.0–8.0)

## 2024-03-09 LAB — SEDIMENTATION RATE: Sed Rate: 9 mm/h (ref 0–20)

## 2024-03-09 LAB — C-REACTIVE PROTEIN: CRP: 0.5 mg/dL (ref <1.0)

## 2024-03-09 MED ORDER — METHOCARBAMOL 500 MG PO TABS
750.0000 mg | ORAL_TABLET | Freq: Once | ORAL | Status: AC
  Administered 2024-03-09: 750 mg via ORAL
  Filled 2024-03-09: qty 2

## 2024-03-09 MED ORDER — SODIUM CHLORIDE 0.9 % IV BOLUS: 1000.0000 mL | Freq: Once | INTRAVENOUS | Status: DC

## 2024-03-09 MED ORDER — KETOROLAC TROMETHAMINE 15 MG/ML IJ SOLN
15.0000 mg | Freq: Once | INTRAMUSCULAR | Status: AC
  Administered 2024-03-09: 15 mg via INTRAVENOUS
  Filled 2024-03-09: qty 1

## 2024-03-09 MED ORDER — SODIUM CHLORIDE 0.9 % IV BOLUS
1000.0000 mL | Freq: Once | INTRAVENOUS | Status: AC
Start: 2024-03-09 — End: 2024-03-09
  Administered 2024-03-09: 1000 mL via INTRAVENOUS

## 2024-03-09 MED ORDER — HYDROMORPHONE HCL 1 MG/ML IJ SOLN
0.5000 mg | Freq: Once | INTRAMUSCULAR | Status: AC
  Administered 2024-03-09: 0.5 mg via INTRAVENOUS
  Filled 2024-03-09: qty 0.5

## 2024-03-09 NOTE — ED Provider Notes (Signed)
-----------------------------------------   9:29 AM on 03/09/2024 -----------------------------------------  I took over care of this patient from Dr. Nicholaus.  Scrotal ultrasound shows no acute findings.  Although the initial plan was to consider inpatient admission for the hyponatremia, the hyponatremia is relatively mild and he is asymptomatic from it.  The patient has no interest in staying in the hospital overnight.  He declined additional fluids after the first liter.  He agreed to a repeat BMP after the first liter which shows an improved sodium of 129.  Therefore I do not feel that there is a strong indication for inpatient admission to treat this further.  I also considered whether it might be helpful to perform the patient's ordered MRI in the ED.  I did investigate this with MRI department, however the prostate MRI requires a bowel prep which the patient has not completed.  Therefore especially since the symptoms are subacute to chronic I would not want to obtain a suboptimal study just out of convenience.  The patient agrees and does not want the MRI done here.  At this time, the patient is comfortable appearing.  He has pain medication at home.  There is no indication for further emergent ED workup.  The patient feels comfortable going home and following up for his MRI later this week as planned.  I gave strict return precautions, and he expressed understanding.   Jacolyn Pae, MD 03/09/24 [phone]

## 2024-03-09 NOTE — ED Notes (Signed)
 Patient refused repeat liter of fluids reporting he doesn't need it. MD notfied order cancelled now going to recheck BMP

## 2024-03-09 NOTE — ED Triage Notes (Addendum)
 Pt reports back in July he had a groin injury, pt reports he is scheduled to have MRI of prostate this Friday but reports pain is getting worse. Pt also reports neck pain, hx arthritis in his neck

## 2024-03-09 NOTE — ED Provider Notes (Signed)
 [facility] Provider Note    Event Date/Time   First MD Initiated Contact with Patient 03/09/24 [id number]     (approximate)   History   Groin Pain   HPI  Andres Dillon is a 63 y.o. male with a past medical history of arthritis and GERD who presents to the emergency department with intractable right hip and right groin pain.  Patient reports that he first developed pain last month after attempting to lift an item.  He has seen multiple physicians for this including his primary care physician, and a urologist.  He had a CT pelvis with contrast completed on 02/06/2024 for this pain and also an ultrasound of the joint completed on 02/24/2024.  He states that the pain radiates to both of his testes but denies any dysuria.  He denies any changes in his bowel habits and is still passing flatus.  He has an MRI of the hip ordered by his primary care physician and an MRI of the prostate scheduled for this Friday.  He states that since developing this pain he has had unintentional weight loss and the pain prevents him from eating.  He denies any fevers or chills, chest pain shortness of breath cough or rash.   Physical Exam   Triage Vital Signs: ED Triage Vitals  Encounter Vitals Group     BP 03/09/24 0400 (!) 155/113     Girls Systolic BP Percentile --      Girls Diastolic BP Percentile --      Boys Systolic BP Percentile --      Boys Diastolic BP Percentile --      Pulse Rate 03/09/24 0400 (!) 104     Resp 03/09/24 0400 19     Temp 03/09/24 0400 98.2 F (36.8 C)     Temp src --      SpO2 03/09/24 0400 98 %     Weight 03/09/24 0404 186 lb 15.2 oz (84.8 kg)     Height 03/09/24 0404 5' 9 (1.753 m)     Head Circumference --      Peak Flow --      Pain Score 03/09/24 0404 6     Pain Loc --      Pain Education --      Exclude from Growth Chart --     Most recent vital signs: Vitals:   03/09/24 0400 03/09/24 0702  BP: (!) 155/113 (!) 150/82  Pulse: (!) 104 78   Resp: 19 18  Temp: 98.2 F (36.8 C)   SpO2: 98% 98%    Nursing Triage Note reviewed. Vital signs reviewed and patients oxygen saturation is normoxic  General: Patient is well nourished, well developed, awake and alert, appears uncomfortable Head: Normocephalic and atraumatic Eyes: Normal inspection, extraocular muscles intact, no conjunctival pallor Ear, nose, throat: Normal external exam Neck: Normal range of motion Respiratory: Patient is in no respiratory distress, lungs CTAB Cardiovascular: Patient is not tachycardic, RRR without murmur appreciated GI: Abd SNT with no guarding or rebound  Back: Normal inspection of the back with good strength and range of motion throughout all ext Extremities: pulses intact with good cap refills, no LE pitting edema or calf tenderness Patient's right hip without any deformity, he has full range of flexion and extension however this causes pain GU exam: Done with nurse chaperone in the room, no penile shaft abnormalities, no scrotal abnormalities, intact cremaster Neuro: The patient is alert and oriented to person, place, and time,  appropriately conversive, with 5/5 bilat UE/LE strength, no gross motor or sensory defects noted. Coordination appears to be adequate. Skin: Warm, dry, and intact Psych: normal mood and affect, no SI or HI  ED Results / Procedures / Treatments   Labs (all labs ordered are listed, but only abnormal results are displayed) Labs Reviewed  CBC WITH DIFFERENTIAL/PLATELET - Abnormal; Notable for the following components:      Result Value   RBC 4.16 (*)    Hemoglobin 12.9 (*)    HCT 35.6 (*)    MCHC 36.2 (*)    All other components within normal limits  BASIC METABOLIC PANEL WITH GFR - Abnormal; Notable for the following components:   Sodium 125 (*)    Potassium 3.4 (*)    Chloride 93 (*)    CO2 19 (*)    Glucose, Bld 108 (*)    Calcium 8.3 (*)    All other components within normal limits  URINALYSIS, ROUTINE W  REFLEX MICROSCOPIC - Abnormal; Notable for the following components:   Color, Urine STRAW (*)    APPearance CLEAR (*)    Specific Gravity, Urine 1.004 (*)    All other components within normal limits  SEDIMENTATION RATE  C-REACTIVE PROTEIN     EKG   RADIOLOGY US  scrotum    PROCEDURES:  Critical Care performed: No  Procedures   MEDICATIONS ORDERED IN ED: Medications  ketorolac  (TORADOL ) 15 MG/ML injection 15 mg (15 mg Intravenous Given 03/09/24 0618)  methocarbamol  (ROBAXIN ) tablet 750 mg (750 mg Oral Given 03/09/24 0619)  sodium chloride  0.9 % bolus 1,000 mL (1,000 mLs Intravenous New Bag/Given 03/09/24 0659)  HYDROmorphone  (DILAUDID ) injection 0.5 mg (0.5 mg Intravenous Given 03/09/24 0701)     IMPRESSION / MDM / ASSESSMENT AND PLAN / ED COURSE                                Differential diagnosis includes, but is not limited to, acute on chronic osteoarthritis, rheumatological issue, epididymitis, electrolyte derangement anemia   ED course: Patient arrives highly uncomfortable but neurovascularly intact.  Presentation does not seem to be consistent with septic arthritis but have ordered an ESR and CRP which is pending.  He does not have a leukocytosis but surprisingly his sodium was 125 which is much lower than his baseline.  I do not have his cause for his intractable pain at this time however given patient's request I have ordered a scrotal ultrasound.  I have deferred ordering CT imaging of the abdomen pelvis as patient has no abdominal pain and had already imaging of the pelvis completed for the similar symptoms just a month prior.  Case signed out to oncoming physician at 7 AM   Clinical Course as of 03/09/24 0722  Tue Mar 09, 2024  [id number] Patient does tell me that he drove himself today [HD]  0624 WBC: 5.5 No leukocytosis [HD]  0646 Sodium(!): 125 Sodium is low [HD]    Clinical Course User Index [HD] Nicholaus Rolland BRAVO, MD   -- Risk: 5 This patient has a high  risk of morbidity due to further diagnostic testing or treatment. Rationale: This patient's evaluation and management involve a high risk of morbidity due to the potential severity of presenting symptoms, need for diagnostic testing, and/or initiation of treatment that may require close monitoring. The differential includes conditions with potential for significant deterioration or requiring escalation of care. Treatment decisions in the  ED, including medication administration, procedural interventions, or disposition planning, reflect this level of risk. COPA: 5 The patient has the following acute or chronic illness/injury that poses a possible threat to life or bodily function: [X] : The patient has a potentially serious acute condition or an acute exacerbation of a chronic illness requiring urgent evaluation and management in the Emergency Department. The clinical presentation necessitates immediate consideration of life-threatening or function-threatening diagnoses, even if they are ultimately ruled out.   FINAL CLINICAL IMPRESSION(S) / ED DIAGNOSES   Final diagnoses:  Hyponatremia  Right inguinal pain  Pain of right hip     Rx / DC Orders   ED Discharge Orders     None        Note:  This document was prepared using Dragon voice recognition software and may include unintentional dictation errors.   Nicholaus Rolland BRAVO, MD 03/09/24 [id number]

## 2024-03-09 NOTE — Discharge Instructions (Addendum)
 Follow-up with your outpatient doctors and for your MRI this week as scheduled.  Continue taking the pain medication as needed.  Return to the ER for new, worsening, or persistent severe pain, difficulty urinating, difficulty with bowel movements, fever, weakness, or any other new or worsening symptoms that concern you.

## 2024-03-09 NOTE — Telephone Encounter (Signed)
 Please review.  KP

## 2024-03-10 ENCOUNTER — Encounter: Payer: Self-pay | Admitting: Family Medicine

## 2024-03-10 NOTE — Assessment & Plan Note (Addendum)
 History of Present Illness Andres Dillon is a 63 year old male with right hip arthritis who presents with persistent hip pain and a recent increase in PSA levels.  Right hip pain and mechanical symptoms - Persistent right hip pain, worsened with activity such as lifting - Temporary relief of pain for two days following a recent injection - Pain persists despite scheduled diclofenac  and as-needed muscle relaxers - Associated with a visible bulge, especially when turning the leg - Suspected exacerbation of pain and possible tear after lifting a heavy object - Discomfort continues with certain movements and activities  Sleep disturbance secondary to hip pain - Initial difficulty sleeping due to hip pain - Sleep improved with use of a pillow between the legs  Elevated prostate-specific antigen (psa) - Recent increase in PSA from 4.8 to 8.1  Results LABS PSA: 4.8 PSA: 8.1  RADIOLOGY Right Hip CT: Moderate arthritis, narrowed femoroacetabular joint space with irregular appearance  Assessment and Plan Right hip osteoarthritis with possible soft tissue injury or tear Chronic right hip pain with significant osteoarthritis and possible soft tissue injury. Temporary relief from injection suggests joint involvement, but bulge and pain indicate soft tissue component. - Order MRI of the hip to evaluate soft tissue and bone. - Continue diclofenac  as needed for pain management. - Use muscle relaxers as needed. - Prescribe hydrocodone  for additional pain control, cautioning about potential constipation. - Advise against heavy lifting or new physical projects until further evaluation. - Coordinate with urology regarding elevated PSA and potential imaging overlap.

## 2024-03-10 NOTE — Progress Notes (Signed)
 Primary Care / Sports Medicine Office Visit  Patient Information:  Patient ID: Andres Dillon, male DOB: 1961-02-17 Age: 63 y.o. MRN: [id number]   Andres Dillon is a pleasant 63 y.o. male presenting with the following:  Chief Complaint  Patient presents with   Hip Pain    Patient having right hip pain that radiates into his right groin area and into thigh. Cramps in his leg as well. Patient has hx of lumbar fusion. Patient had injection a few weeks ago which helped for a couple days.     Vitals:   03/04/24 1118  BP: (!) 150/92  Pulse: 87  SpO2: 99%   Vitals:   03/04/24 1118  Weight: 187 lb (84.8 kg)  Height: 5' 9 (1.753 m)   Body mass index is 27.62 kg/m.  US  SCROTUM W/DOPPLER Result Date: 03/09/2024 EXAM: ULTRASOUND SCROTUM/TESTICLES WITH DOPPLER FLOW EVALUATION 03/09/2024 07:02:00 AM TECHNIQUE: Duplex ultrasound using B-mode/gray scaled imaging, Doppler spectral analysis and color flow Doppler was obtained of the testicles. COMPARISON: None available. CLINICAL HISTORY: Scrotal pain. FINDINGS: RIGHT: MEASUREMENTS: GREY SCALE: The right testicle demonstrates normal homogeneous echotexture without focal lesion. There is a 4 mm calculus within the right testicle. DOPPLER EVALUATION: There is normal arterial and venous Doppler flow within the testicle. VARICOCELES: Small bilateral varicoceles present. SCROTAL SAC: No hydrocele. EPIDIDYMIS: No acute abnormality. LEFT: MEASUREMENTS: GREY SCALE: The left testicle demonstrates normal homogeneous echotexture without focal lesion. No testicular microlithiasis. DOPPLER EVALUATION: There is normal arterial and venous Doppler flow within the testicle. VARICOCELES: Small bilateral varicoceles present. SCROTAL SAC: There is a small left hydrocele. EPIDIDYMIS: No acute abnormality. IMPRESSION: 1. Small bilateral varicoceles and small left hydrocele. Electronically signed by: Evalene Coho MD 03/09/2024 07:08 AM EDT RP Workstation: [id number]    US  LIMITED JOINT SPACE STRUCTURES LOW RIGHT Result Date: 02/24/2024 Procedure:  Injection of right hip under ultrasound guidance. Ultrasound guidance utilized for in-plane approach to the right femoral head-neck junction, sonographic evidence of injectate noted Samsung HS60 device utilized with permanent recording / reporting. Verbal informed consent obtained and verified. Skin prepped in a sterile fashion. Ethyl chloride for topical local analgesia. Completed without difficulty and tolerated well. Medication: triamcinolone  acetonide 40 mg/mL suspension for injection 1 mL total and 2 mL lidocaine 1% without epinephrine utilized for needle placement anesthetic Advised to contact for fevers/chills, erythema, induration, drainage, or persistent bleeding.     Discussed the use of AI scribe software for clinical note transcription with the patient, who gave verbal consent to proceed.   Independent interpretation of notes and tests performed by another provider:   None  Procedures performed:   None  Pertinent History, Exam, Impression, and Recommendations:   Problem List Items Addressed This Visit     Primary osteoarthritis of right hip - Primary   History of Present Illness Andres Dillon is a 63 year old male with right hip arthritis who presents with persistent hip pain and a recent increase in PSA levels.  Right hip pain and mechanical symptoms - Persistent right hip pain, worsened with activity such as lifting - Temporary relief of pain for two days following a recent injection - Pain persists despite scheduled diclofenac  and as-needed muscle relaxers - Associated with a visible bulge, especially when turning the leg - Suspected exacerbation of pain and possible tear after lifting a heavy object - Discomfort continues with certain movements and activities  Sleep disturbance secondary to hip pain - Initial difficulty sleeping due to hip  pain - Sleep improved with use of a pillow  between the legs  Elevated prostate-specific antigen (psa) - Recent increase in PSA from 4.8 to 8.1  Results LABS PSA: 4.8 PSA: 8.1  RADIOLOGY Right Hip CT: Moderate arthritis, narrowed femoroacetabular joint space with irregular appearance  Assessment and Plan Right hip osteoarthritis with possible soft tissue injury or tear Chronic right hip pain with significant osteoarthritis and possible soft tissue injury. Temporary relief from injection suggests joint involvement, but bulge and pain indicate soft tissue component. - Order MRI of the hip to evaluate soft tissue and bone. - Continue diclofenac  as needed for pain management. - Use muscle relaxers as needed. - Prescribe hydrocodone  for additional pain control, cautioning about potential constipation. - Advise against heavy lifting or new physical projects until further evaluation. - Coordinate with urology regarding elevated PSA and potential imaging overlap.        Orders & Medications Medications: No orders of the defined types were placed in this encounter.  No orders of the defined types were placed in this encounter.    No follow-ups on file.     Selinda JINNY Ku, MD, [facility]   Primary Care Sports Medicine Primary Care and Sports Medicine at [facility]

## 2024-03-10 NOTE — Patient Instructions (Signed)
 Patient Plan  Right Hip Osteoarthritis and Possible Soft Tissue Injury  - MRI of the right hip has been ordered to evaluate soft tissue and bone. - Continue diclofenac  as needed for pain. - Use muscle relaxers as needed for discomfort. - Take hydrocodone  for additional pain control if needed; be aware of possible constipation. - Avoid heavy lifting or starting new physical projects until further evaluation. - Coordination with urology is ongoing regarding elevated PSA and imaging needs.  Red flags - seek care if you notice:  - Sudden or severe hip pain, swelling, or inability to move the leg - Signs of infection (fever, chills, redness, or warmth at the hip) - Severe constipation or difficulty urinating - New or worsening symptoms that concern you

## 2024-03-10 NOTE — Telephone Encounter (Signed)
 Please review.  KP

## 2024-03-11 ENCOUNTER — Other Ambulatory Visit: Payer: Self-pay | Admitting: Family Medicine

## 2024-03-11 DIAGNOSIS — S76211A Strain of adductor muscle, fascia and tendon of right thigh, initial encounter: Secondary | ICD-10-CM

## 2024-03-12 ENCOUNTER — Telehealth: Payer: Self-pay

## 2024-03-12 ENCOUNTER — Ambulatory Visit
Admission: RE | Admit: 2024-03-12 | Discharge: 2024-03-12 | Disposition: A | Discharge status: Home / Self Care | Source: Ambulatory Visit | Attending: Urology | Admitting: Urology

## 2024-03-12 DIAGNOSIS — R972 Elevated prostate specific antigen [PSA]: Secondary | ICD-10-CM | POA: Diagnosis not present

## 2024-03-12 DIAGNOSIS — N4289 Other specified disorders of prostate: Secondary | ICD-10-CM | POA: Diagnosis not present

## 2024-03-12 MED ORDER — GADOBUTROL 1 MMOL/ML IV SOLN
7.5000 mL | Freq: Once | INTRAVENOUS | Status: AC | PRN
  Administered 2024-03-12: 7.5 mL via INTRAVENOUS

## 2024-03-12 NOTE — Telephone Encounter (Deleted)
 Copied from CRM #[id number]. Topic: Clinical - Red Word Triage >> Mar 12, 2024  8:16 AM Emylou G wrote: [facility] that prompted transfer to Nurse Triage: Reason for Triage: Patient called.. inquiring: MRI on prostate this morning.. Concerned about his bones-in pain.SABRA He is asking if he can get red and white blood count labwork done today after 10am ( I did put in message to clinical )  He wants to be seen today. >> Mar 12, 2024  8:28 AM Emylou G wrote: Tried to wait on the line.. he had 9am drs appt for MRI.SABRA He wants to be seen and test for cancer today.. he should be out of his appt after 10a

## 2024-03-12 NOTE — Telephone Encounter (Signed)
 Copied from CRM [id number]. Topic: Appointments - Scheduling Inquiry for Clinic >> Mar 12, 2024  9:40 AM Andres Dillon wrote: Reason for CRM: The patient has called to inquire if it would be possible to be seen by their PCP today 03/12/24 to discuss concerns related to their blood cell count and prostate. The patient is also interested in discussing their imaging options with a member of clinical staff. Please contact when available

## 2024-03-12 NOTE — Telephone Encounter (Signed)
 Requested medications are due for refill today.  yes  Requested medications are on the active medications list.  yes  Last refill. 02/20/2024 #90 0 rf  Future visit scheduled.   yes  Notes to clinic.  Refill not delegated.     Requested Prescriptions  Pending Prescriptions Disp Refills   cyclobenzaprine  (FLEXERIL ) 5 MG tablet [Pharmacy Med Name: CYCLOBENZAPRINE  5 MG TABLET] 90 tablet 0    Sig: Take 1-2 tablets (5-10 mg total) by mouth 3 (three) times daily as needed.     Not Delegated - Analgesics:  Muscle Relaxants Failed - 03/12/2024 11:49 AM      Failed - This refill cannot be delegated      Failed - Valid encounter within last 6 months    Recent Outpatient Visits           1 week ago Primary osteoarthritis of right hip   [place] [facility] Alvia, Selinda PARAS, MD   3 weeks ago Strain of adductor magnus muscle of right lower extremity, initial encounter   [facility] Alvia, Selinda PARAS, MD   3 weeks ago Inguinal strain, right, subsequent encounter   [facility] McLean, Megan P, DO   7 months ago Routine general medical examination at a health care facility   [facility] Vicci Duwaine SQUIBB, DO       Future Appointments             In 5 months Vicci, Duwaine SQUIBB, DO [place], [address]   In 5 months Stoioff, Glendia BROCKS, MD [facility] Urology [facility]

## 2024-03-12 NOTE — Telephone Encounter (Signed)
 Copied from CRM #[id number]. Topic: Clinical - Lab/Test Results >> Mar 12, 2024  8:13 AM Andres Dillon wrote: Reason for CRM: Patient called.. inquiring: MRI on prostate this morning.. Concerned about his bones-in pain.SABRA He is asking if he can get red and white blood count labwork done today after 10am

## 2024-03-12 NOTE — Telephone Encounter (Signed)
 Called patient, he will call back to schedule appt if he still needs.Offered Monday with a different provider.

## 2024-03-14 ENCOUNTER — Other Ambulatory Visit: Payer: Self-pay

## 2024-03-14 ENCOUNTER — Emergency Department

## 2024-03-14 ENCOUNTER — Ambulatory Visit: Payer: Self-pay | Admitting: Urology

## 2024-03-14 ENCOUNTER — Emergency Department: Admission: EM | Admit: 2024-03-14 | Discharge: 2024-03-14 | Disposition: A | Discharge status: Home / Self Care

## 2024-03-14 DIAGNOSIS — N5082 Scrotal pain: Secondary | ICD-10-CM | POA: Insufficient documentation

## 2024-03-14 DIAGNOSIS — K5900 Constipation, unspecified: Secondary | ICD-10-CM | POA: Insufficient documentation

## 2024-03-14 DIAGNOSIS — R109 Unspecified abdominal pain: Secondary | ICD-10-CM | POA: Diagnosis not present

## 2024-03-14 DIAGNOSIS — M25551 Pain in right hip: Secondary | ICD-10-CM | POA: Diagnosis not present

## 2024-03-14 LAB — URINALYSIS, ROUTINE W REFLEX MICROSCOPIC
Bilirubin Urine: NEGATIVE
Glucose, UA: NEGATIVE mg/dL
Hgb urine dipstick: NEGATIVE
Ketones, ur: NEGATIVE mg/dL
Leukocytes,Ua: NEGATIVE
Nitrite: NEGATIVE
Protein, ur: NEGATIVE mg/dL
Specific Gravity, Urine: 1.01 (ref 1.005–1.030)
pH: 6 (ref 5.0–8.0)

## 2024-03-14 LAB — CBC
HCT: 38.9 % — ABNORMAL LOW (ref 39.0–52.0)
Hemoglobin: 13.5 g/dL (ref 13.0–17.0)
MCH: 30.7 pg (ref 26.0–34.0)
MCHC: 34.7 g/dL (ref 30.0–36.0)
MCV: 88.4 fL (ref 80.0–100.0)
Platelets: 279 K/uL (ref 150–400)
RBC: 4.4 MIL/uL (ref 4.22–5.81)
RDW: 13.8 % (ref 11.5–15.5)
WBC: 6.2 K/uL (ref 4.0–10.5)
nRBC: 0 % (ref 0.0–0.2)

## 2024-03-14 LAB — COMPREHENSIVE METABOLIC PANEL WITH GFR
ALT: 39 U/L (ref 0–44)
AST: 28 U/L (ref 15–41)
Albumin: 4.1 g/dL (ref 3.5–5.0)
Alkaline Phosphatase: 38 U/L (ref 38–126)
Anion gap: 9 (ref 5–15)
BUN: 12 mg/dL (ref 8–23)
CO2: 25 mmol/L (ref 22–32)
Calcium: 8.7 mg/dL — ABNORMAL LOW (ref 8.9–10.3)
Chloride: 99 mmol/L (ref 98–111)
Creatinine, Ser: 1.03 mg/dL (ref 0.61–1.24)
GFR, Estimated: >60 mL/min (ref >60)
Glucose, Bld: 129 mg/dL — ABNORMAL HIGH (ref 70–99)
Potassium: 3.9 mmol/L (ref 3.5–5.1)
Sodium: 133 mmol/L — ABNORMAL LOW (ref 135–145)
Total Bilirubin: 1 mg/dL (ref 0.0–1.2)
Total Protein: 6.7 g/dL (ref 6.5–8.1)

## 2024-03-14 MED ORDER — SIMETHICONE 80 MG PO TABS: 80.0000 mg | ORAL_TABLET | Freq: Four times a day (QID) | ORAL | 0 refills | Status: AC | PRN

## 2024-03-14 MED ORDER — POLYETHYLENE GLYCOL 3350 17 G PO PACK: 17.0000 g | PACK | Freq: Every day | ORAL | 0 refills | Status: AC

## 2024-03-14 MED ORDER — BISACODYL 5 MG PO TBEC: 5.0000 mg | DELAYED_RELEASE_TABLET | Freq: Every day | ORAL | 0 refills | Status: AC | PRN

## 2024-03-14 NOTE — ED Triage Notes (Signed)
 Pt to ED via POV from home. Pt reports constipation x1 wk. Pt reports relief with at home remedies and did an enema this morning and had a BM but concerned that is may be from enlarged prostate with hx of prostate issues.

## 2024-03-14 NOTE — Discharge Instructions (Signed)
 You were seen in our emergency department for concern small bowel obstruction in the setting of constipation.  Your blood work and imaging did not identify an emergency today.  You are safe to return home.  Continue your regular medications.  Continue your workup with your urologist and your primary care physician. -- RETURN PRECAUTIONS & AFTERCARE: (ENGLISH) RETURN PRECAUTIONS: Return immediately to the emergency department or see/call your doctor if you feel worse, weak or have changes in speech or vision, are short of breath, have fever, vomiting, pain, bleeding or dark stool, trouble urinating or any new issues. Return here or see/call your doctor if not improving as expected for your suspected condition. FOLLOW-UP CARE: Call your doctor and/or any doctors we referred you to for more advice and to make an appointment. Do this today, tomorrow or after the weekend. Some doctors only take PPO insurance so if you have HMO insurance you may want to contact your HMO or your regular doctor for referral to a specialist within your plan. Either way tell the doctor's office that it was a referral from the emergency department so you get the soonest possible appointment.  YOUR TEST RESULTS: Take result reports of any blood or urine tests, imaging tests and EKG's to your doctor and any referral doctor. Have any abnormal tests repeated. Your doctor or a referral doctor can let you know when this should be done. Also make sure your doctor contacts this hospital to get any test results that are not currently available such as cultures or special tests for infection and final imaging reports, which are often not available at the time you leave the ER but which may list additional important findings that are not documented on the preliminary report. BLOOD PRESSURE: If your blood pressure was greater than 120/80 have your blood pressure rechecked within 1 to 2 weeks. MEDICATION SIDE EFFECTS: Do not drive, walk, bike, take  the bus, etc. if you have received or are being prescribed any sedating medications such as those for pain or anxiety or certain antihistamines like Benadryl. If you have been give one of these here get a taxi home or have a friend drive you home. Ask your pharmacist to counsel you on potential side effects of any new medication

## 2024-03-14 NOTE — ED Provider Notes (Signed)
 [facility] Provider Note    Event Date/Time   First MD Initiated Contact with Patient 03/14/24 1226     (approximate)   History   Constipation   HPI  Andres Dillon is a 63 y.o. male who returns to the emergency department after recent visit last week for groin and scrotal pain now with concern for constipation resulting in a small bowel obstruction.  Patient is currently being worked up for possible prostate malignancy.  He states that he has had constipation for 1 week and today gave himself an enema that resulted in a large bowel movement.  He is concerned acutely that he may have a small bowel obstruction.  He denies any nausea or vomiting but reports continued pain suprapubically and into his groin.  He denies any bloody bowel movements.  No SI HI or AVH S      Physical Exam   Triage Vital Signs: ED Triage Vitals  Encounter Vitals Group     BP 03/14/24 1204 112/85     Girls Systolic BP Percentile --      Girls Diastolic BP Percentile --      Boys Systolic BP Percentile --      Boys Diastolic BP Percentile --      Pulse Rate 03/14/24 1204 80     Resp 03/14/24 1204 18     Temp 03/14/24 1204 98.1 F (36.7 C)     Temp Source 03/14/24 1204 Oral     SpO2 03/14/24 1204 100 %     Weight --      Height --      Head Circumference --      Peak Flow --      Pain Score 03/14/24 1205 2     Pain Loc --      Pain Education --      Exclude from Growth Chart --     Most recent vital signs: Vitals:   03/14/24 1204 03/14/24 1228  BP: 112/85   Pulse: 80   Resp: 18   Temp: 98.1 F (36.7 C)   SpO2: 100% 100%    Nursing Triage Note reviewed. Vital signs reviewed and patients oxygen saturation is normoxic  General: Patient is well nourished, well developed, awake and alert, resting comfortably in no acute distress Head: Normocephalic and atraumatic Eyes: Normal inspection, extraocular muscles intact, no conjunctival pallor Ear, nose, throat: Normal  external exam Neck: Normal range of motion Respiratory: Patient is in no respiratory distress,  Cardiovascular: Patient is not tachycardic GI: Abd SNT with no guarding or rebound  Back: Normal inspection of the back with good strength and range of motion throughout all ext Extremities: pulses intact with good cap refills, no LE pitting edema or calf tenderness Neuro: The patient is alert and oriented to person, place, and time, appropriately conversive, with 5/5 bilat UE/LE strength, no gross motor or sensory defects noted. Coordination appears to be adequate. Skin: Warm, dry, and intact Psych: normal mood and affect, no SI or HI  ED Results / Procedures / Treatments   Labs (all labs ordered are listed, but only abnormal results are displayed) Labs Reviewed  CBC - Abnormal; Notable for the following components:      Result Value   HCT 38.9 (*)    All other components within normal limits  COMPREHENSIVE METABOLIC PANEL WITH GFR - Abnormal; Notable for the following components:   Sodium 133 (*)    Glucose, Bld 129 (*)  Calcium 8.7 (*)    All other components within normal limits  URINALYSIS, ROUTINE W REFLEX MICROSCOPIC - Abnormal; Notable for the following components:   Color, Urine YELLOW (*)    APPearance CLEAR (*)    All other components within normal limits     EKG None  RADIOLOGY KUB: No small bowel obstruction on my independent review interpretation and radiologist agrees    PROCEDURES:  Critical Care performed: No  Procedures   MEDICATIONS ORDERED IN ED: Medications - No data to display   IMPRESSION / MDM / ASSESSMENT AND PLAN / ED COURSE                                Differential diagnosis includes, but is not limited to, constipation, UTI, electrolyte derangement anemia   ED course: Patient presents with acute concern that he may have a bowel obstruction and possibility prostate metastases to the bone.  Patient counseled that regarding his  prostate workup he would need to follow-up with his urologist for which he already has an appointment for.  Urinalysis demonstrated no UTI.  He had no leukocytosis or profound anemia.  He had no acute renal insufficiency.  KUB demonstrated no bowel dilation or concern for small bowel obstruction.  Unfortunately patient became increasingly nervous about his workup that we may be missing something and it took multiple reassessments and explanations that something may wrong, but that he was safe to continue the workup with his primary care physician and neurologist.  I attempted to answer his questions to the best of my ability.  Given patient's concern for constipation for a week I did initiate a bowel regimen  Patient had multiple questions. I did my best to answer all of them. Significantly more than the usual amount of time was spent trying to explain testing and results diagnosis or lack there of plan for follow-up and further outpatient care and especially reasons to return to the emergency department   Clinical Course as of 03/14/24 1549  Sun Mar 14, 2024  1239 CBC(!) No acute abnormality [HD]  1239 Comprehensive metabolic panel(!) No acute abnormality [HD]  1350 Counseled patient on results.  Will look at discharge afterwards [HD]  1410 Patient has taken p.o. without difficulty.  I have spent a copious amount of time reviewing his workup thus far, I do wonder whether generalized anxiety is playing a role here.  He has ambulated and encouraged to follow-up with his primary team [HD]    Clinical Course User Index [HD] Nicholaus Rolland BRAVO, MD   At time of discharge there is no evidence of acute life, limb, vision, or fertility threat. Patient has stable vital signs, pain is well controlled, patient is ambulatory and p.o. tolerant.  Discharge instructions were completed using the EPIC system. I would refer you to those at this time. All warnings prescriptions follow-up etc. were discussed in detail  with the patient. Patient indicates understanding and is agreeable with this plan. All questions answered.  Patient is made aware that they may return to the emergency department for any worsening or new condition or for any other emergency.   -- Risk: 5 This patient has a high risk of morbidity due to further diagnostic testing or treatment. Rationale: This patient's evaluation and management involve a high risk of morbidity due to the potential severity of presenting symptoms, need for diagnostic testing, and/or initiation of treatment that may require close monitoring.  The differential includes conditions with potential for significant deterioration or requiring escalation of care. Treatment decisions in the ED, including medication administration, procedural interventions, or disposition planning, reflect this level of risk. COPA: 5 The patient has the following acute or chronic illness/injury that poses a possible threat to life or bodily function: [X] : The patient has a potentially serious acute condition or an acute exacerbation of a chronic illness requiring urgent evaluation and management in the Emergency Department. The clinical presentation necessitates immediate consideration of life-threatening or function-threatening diagnoses, even if they are ultimately ruled out.   FINAL CLINICAL IMPRESSION(S) / ED DIAGNOSES   Final diagnoses:  Constipation, unspecified constipation type     Rx / DC Orders   ED Discharge Orders          Ordered    polyethylene glycol (MIRALAX ) 17 g packet  Daily        03/14/24 1402    Simethicone  80 MG TABS  Every 6 hours PRN        03/14/24 1402    bisacodyl  (DULCOLAX) 5 MG EC tablet  Daily PRN        03/14/24 1402             Note:  This document was prepared using Dragon voice recognition software and may include unintentional dictation errors.   Nicholaus Rolland BRAVO, MD 03/14/24 [phone]

## 2024-03-15 ENCOUNTER — Ambulatory Visit: Payer: Self-pay

## 2024-03-15 ENCOUNTER — Telehealth: Payer: Self-pay

## 2024-03-15 ENCOUNTER — Ambulatory Visit: Payer: Self-pay | Admitting: *Deleted

## 2024-03-15 ENCOUNTER — Telehealth: Payer: Self-pay | Admitting: *Deleted

## 2024-03-15 DIAGNOSIS — Z0000 Encounter for general adult medical examination without abnormal findings: Secondary | ICD-10-CM

## 2024-03-15 NOTE — Patient Outreach (Signed)
 EMMI referral received, Patient contacted to discuss referral and to schedule initial assessment. Patient agreeable to phone call in approximately 2 weeks.Initial appointment scheduled for 04/05/24 at 11am.   Lenn Mean, LCSW [place]  [facility], [facility] Licensed Clinical Social Worker  Direct Dial: [phone]

## 2024-03-15 NOTE — Telephone Encounter (Signed)
 I can see him Wednesday on Same day unless someone else has something sooner

## 2024-03-15 NOTE — Telephone Encounter (Signed)
 Please review and advise.  JM

## 2024-03-15 NOTE — Telephone Encounter (Signed)
 Copied from CRM [id number]. Topic: Clinical - Red Word Triage >> Mar 15, 2024  7:36 AM Everette C wrote: [facility] that prompted transfer to Nurse Triage: The patient is concerned that they have a colorectal blockage/concern. The patient shares that they've lost roughly 10 lbs this week. Reason for Disposition  [1] SEVERE pain AND [2] age > 60 years  Answer Assessment - Initial Assessment Questions 1. LOCATION: Where does it hurt?      I just pulled up in your parking lot.  I have a blockage.   I went to the ED yesterday.  I can't eat for a week.   My body goes into shock.   My back hurts and my stomach is tumbling.  Passing a lot of gas.    I've lost 10 lbs.  I don't know what is wrong.    I referred him back to the ED.   They can't find anything wrong.   It's hard to explain.  They do blood work and check my urine.   I think I need to find out if there is a blockage.    They did an x ray of my stomach and pelvis. I hurt mostly after I eat and try to pass a BM.   It will not come out.    Only way I can have a BM is do a saline enema and a bowel cleanse.   If I don't do those 2 things nothing comes out.    Yesterday morning when I woke up with extreme pain in my belly and back.   I did an enema.  I drank bowel flush stuff.   Some came out.   I saw the same dr the same time I went to the ED.    I don't know what I need but something needs to be done.   2. RADIATION: Does the pain shoot anywhere else? (e.g., chest, back)     Myback Right now I'm not in pain.   It happens when I try to eat.   I don't know if it's my prostate pushing on my colon. 3. ONSET: When did the pain begin? (Minutes, hours or days ago)      Not eating for over a week.   Over a week ago. 4. SUDDEN: Gradual or sudden onset?     Suddenly pain  5. PATTERN Does the pain come and go, or is it constant?     When I try to eat I'm losing my face fat and losing weight 10 lbs in the last week.  I would not be at the office now  but I have to find out what is wrong. 6. SEVERITY: How bad is the pain?  (e.g., Scale 1-10; mild, moderate, or severe)     No pain right now.  Except my neck hurts a little.   I tried to push to have a BM this morning and it hurts in my neck.   7. RECURRENT SYMPTOM: Have you ever had this type of stomach pain before? If Yes, ask: When was the last time? and What happened that time?      Yes went to ED.   8. CAUSE: What do you think is causing the stomach pain? (e.g., gallstones, recent abdominal surgery)     Maybe a blockage. 9. RELIEVING/AGGRAVATING FACTORS: What makes it better or worse? (e.g., antacids, bending or twisting motion, bowel movement)     Eating making make sit much worse. 10. OTHER SYMPTOMS:  Do you have any other symptoms? (e.g., back pain, diarrhea, fever, urination pain, vomiting)       See above  Protocols used: Abdominal Pain - Male-A-AH FYI Only or Action Required?: Action required by provider: request for appointment and update on patient condition.  Patient was last seen in primary care on 03/04/2024 by Alvia Selinda PARAS, MD.  Called Nurse Triage reporting Abdominal Pain and Constipation. Pt is sitting in the parking lot now.    Symptoms began a week ago. Went to ED 03/14/2024.   Having abd and back pain.   Unable to have BM without enema.   Not eaten in a week.   Has lost 10 lbs.   Refuses to go to the ED again.  Interventions attempted: OTC medications: enema and bowel flush   Can't eat because he thinks he has a blockage..  Symptoms are: rapidly worsening.  Insisting on talking with Dr. Vicci.  Refusing to return to the ED.  Triage Disposition: Go to ED Now (Notify PCP)  Patient/caregiver understands and will follow disposition?: No, refuses disposition

## 2024-03-15 NOTE — Telephone Encounter (Signed)
 FYI Only or Action Required?: FYI only for provider.  Patient was last seen in primary care on 03/04/2024 by Alvia Selinda PARAS, MD.  Called Nurse Triage reporting Weight Loss and Constipation.  Symptoms began a week ago.  Interventions attempted: OTC medications: enema.  Symptoms are: unchanged.  Triage Disposition: See Physician Within 24 Hours  Patient/caregiver understands and will follow disposition?: Yes      Reason for Disposition  SEVERE weight loss (e.g., BMI < 15; weight loss that is rapid; taking little or no food by mouth)  Answer Assessment - Initial Assessment Questions 1. MAIN CONCERN: What is your main concern today?     Weight loss 2. WEIGHT LOSS: How much weight have you lost?  (e.g., lbs., kgs.)  Over what period of time have you lost this weight?  (e.g., number of days, weeks, months, years)     10 lbs loss in the last week 3. BASELINE WEIGHT: What is your baseline or normal weight? (e.g., How much do you usually weigh?)     186# 4. CAUSE: What do you think is causing the weight loss? (e.g., depression, anxiety, medicine side effect, pain, trouble swallowing, substance or alcohol use problem, eating disorder)     Pt endorses issues with irregular BM, so does not eat much d/t  5. PRIOR EVALUATION: Have you been evaluated by a doctor for your weight loss? If Yes, ask When was your last visit? What did your doctor (or NP/PA) tell you about the possible cause?     No  6. HEART FAILURE TREATMENT: Do you have heart failure? If Yes, ask: Have you taken new or extra water pills (diuretics) recently? (e.g., furosemide; bumetanide). What is your target weight?     denies 7. OTHER SYMPTOMS: Do you have any other symptoms? (e.g., anxiety or depression, blood in stool, breathing difficulty, diarrhea, fever, trouble swallowing)     denies 8. PREGNANCY: Is there any chance you are pregnant? When was your last menstrual period?     N/a    Pt  already had appt scheduled within dispo timeframe. Patient verbalized understanding and to call back with worsening symptoms.  Protocols used: Weight Loss - Unintended-A-AH

## 2024-03-16 ENCOUNTER — Ambulatory Visit: Admitting: Pediatrics

## 2024-03-16 VITALS — BP 124/79 | HR 98 | Temp 97.8°F | Wt 177.0 lb

## 2024-03-16 DIAGNOSIS — R972 Elevated prostate specific antigen [PSA]: Secondary | ICD-10-CM

## 2024-03-16 DIAGNOSIS — F419 Anxiety disorder, unspecified: Secondary | ICD-10-CM

## 2024-03-16 DIAGNOSIS — K5900 Constipation, unspecified: Secondary | ICD-10-CM | POA: Diagnosis not present

## 2024-03-16 DIAGNOSIS — R52 Pain, unspecified: Secondary | ICD-10-CM

## 2024-03-16 MED ORDER — HYDROXYZINE HCL 10 MG PO TABS: 5.0000 mg | ORAL_TABLET | Freq: Three times a day (TID) | ORAL | 0 refills | Status: DC | PRN

## 2024-03-16 MED ORDER — AMITRIPTYLINE HCL 50 MG PO TABS: 50.0000 mg | ORAL_TABLET | Freq: Every evening | ORAL | 1 refills | Status: DC | PRN

## 2024-03-16 NOTE — Progress Notes (Signed)
 Office Visit  BP 124/79   Pulse 98   Temp 97.8 F (36.6 C) (Oral)   Wt 177 lb (80.3 kg)   SpO2 98%   BMI 26.14 kg/m    Subjective:    Patient ID: [name], male    DOB: Jan 10, 1961, 63 y.o.   MRN: [id number]  HPI: KALEB SEK is a 63 y.o. male  Chief Complaint  Patient presents with   Constipation    Started less than 2 weeks ago , ongoing prostate issues wants PSA checked patient believes he has prostate cancer     Discussed the use of AI scribe software for clinical note transcription with the patient, who gave verbal consent to proceed.  History of Present Illness   QUIENTIN JENT is a 63 year old male with prostate cancer who presents with persistent constipation and bone pain.  He experiences persistent constipation, requiring enemas or bowel cleansers to have a bowel movement. This is a new issue and has resulted in a weight loss of approximately ten pounds. He can pass gas but is unsure if his last bowel movement was effective. Over-the-counter treatments like Miralax  and magnesium citrate have not provided significant relief. He is due for a colonoscopy, with the last one being two years ago.  He describes widespread bone pain, including in his ankles, feet, back, and neck. He has a history of spinal fusion and reports that his pain began after lifting heavy objects, initially causing groin pain. He has received a cortisone shot in his hip. He experiences significant bone pain and has had a CT scan and a prostate MRI, with PSA levels being monitored.  He reports a decreased appetite and difficulty eating, contributing to his weight loss. He is concerned about his nutritional intake and has not been using protein shakes due to concerns about sugar content.  He has a history of anxiety and difficulty sleeping, primarily due to pain. He has tried medications like trazodone  in the past but experienced adverse effects. He is currently taking Cymbalta for anxiety and  nerve pain.  He lives with his partner and has been in a long-term relationship for almost fifteen years. His brother is a Careers information officer.        Relevant past medical, surgical, family and social history reviewed and updated as indicated. Interim medical history since our last visit reviewed. Allergies and medications reviewed and updated.  ROS per HPI unless specifically indicated above     Objective:    BP 124/79   Pulse 98   Temp 97.8 F (36.6 C) (Oral)   Wt 177 lb (80.3 kg)   SpO2 98%   BMI 26.14 kg/m   Wt Readings from Last 3 Encounters:  03/23/24 175 lb 3.2 oz (79.5 kg)  03/16/24 177 lb (80.3 kg)  03/09/24 186 lb 15.2 oz (84.8 kg)     Physical Exam Constitutional:      Appearance: Normal appearance.  HENT:     Head: Normocephalic and atraumatic.  Eyes:     Pupils: Pupils are equal, round, and reactive to light.  Abdominal:     General: Abdomen is flat. There is no distension.     Palpations: Abdomen is soft.     Tenderness: There is no abdominal tenderness. There is no guarding or rebound.  Musculoskeletal:        General: Normal range of motion.     Cervical back: Normal range of motion.  Skin:    General: Skin  is warm and dry.     Capillary Refill: Capillary refill takes less than 2 seconds.  Neurological:     General: No focal deficit present.     Mental Status: He is alert. Mental status is at baseline.  Psychiatric:        Mood and Affect: Mood normal.        Behavior: Behavior normal.         03/16/2024    2:32 PM 02/20/2024    9:37 AM 08/11/2023    2:20 PM 11/11/2022   11:34 AM 08/07/2022    8:32 AM  Depression screen PHQ 2/9  Decreased Interest 2 0 0 0 0  Down, Depressed, Hopeless 2 0 0 0 0  PHQ - 2 Score 4 0 0 0 0  Altered sleeping 2  2 2 1   Tired, decreased energy 2  1 2 1   Change in appetite 3  0 1 0  Feeling bad or failure about yourself  2  0 0 0  Trouble concentrating 2  0 1 0  Moving slowly or fidgety/restless 1  0 1 0   Suicidal thoughts 1  0 0 0  PHQ-9 Score 17  3 7 2   Difficult doing work/chores Not difficult at all   Somewhat difficult Not difficult at all       03/16/2024    2:32 PM 08/11/2023    2:20 PM 11/11/2022   11:35 AM 08/07/2022    8:32 AM  GAD 7 : Generalized Anxiety Score  Nervous, Anxious, on Edge 1 0 1 0  Control/stop worrying 1 0 1 0  Worry too much - different things 1 0 1 0  Trouble relaxing 2 0 2 0  Restless 1 0 1 0  Easily annoyed or irritable 1 0 2 0  Afraid - awful might happen 1 0 1 0  Total GAD 7 Score 8 0 9 0  Anxiety Difficulty Not difficult at all Not difficult at all Somewhat difficult        Assessment & Plan:  Assessment & Plan   Constipation, unspecified constipation type Chronic constipation with poor appetite and recent unintentional weight loss. Differential includes possible obstruction or gastrointestinal issues. Previous CT scan showed hip arthritis. No blood in stool. Due for colonoscopy in 2023. - Refer to GI for urgent evaluation and possible colonoscopy. - Recommend Senokot for bowel movements. - Continue Miralax  and magnesium citrate as needed. - Order blood work: thyroid  levels, electrolytes, ferritin. -     Basic metabolic panel with GFR -     TSH -     Ferritin -     Iron and TIBC -     CBC with Differential/Platelet -     Amitriptyline  HCl; Take 1 tablet (50 mg total) by mouth at bedtime as needed for sleep.  Dispense: 30 tablet; Refill: 1 -     Ambulatory referral to Gastroenterology  Anxiety Anxiety and insomnia related to pain and health concerns. Previous trazodone  use had adverse effects. - Prescribe hydroxyzine  as needed for anxiety. - Prescribe amitriptyline  at night for pain and insomnia.   Generalized pain Generalized bone pain in ankles, feet, back, and neck. History of spinal fusion. Concerns about metastases from prostate cancer. Previous imaging showed hip arthritis, no other abnormalities. - Order bone scan for metastases  evaluation. - Message urologist to discuss symptoms and imaging. - Consider amitriptyline  at night for pain management. -     Amitriptyline  HCl; Take 1 tablet (50 mg  total) by mouth at bedtime as needed for sleep.  Dispense: 30 tablet; Refill: 1  Elevated PSA Prostate cancer with two low-grade lesions. Concerns about metastases to bones. Urologist plans to repeat prostate cancer levels in a month. - Coordinate with urologist for follow-up on prostate cancer levels. - Order bone scan for metastases evaluation. -     PSA   Follow up plan: No follow-ups on file.  Hadassah SHAUNNA Nett, MD

## 2024-03-16 NOTE — Patient Instructions (Addendum)
 For constipation - miralax  can soften stool - senna (sennokot) helps move colon   I am also sending hydroxizine 10mg  as needed. Feel free to break this in half if you find it too sedating. Max 30mg  total per day (three of the 10mg  tabs).  For sleep and pain, try amitriptyline  50mg  nightly  I will send the GI referral and reach out to your urologist

## 2024-03-17 DIAGNOSIS — M7601 Gluteal tendinitis, right hip: Secondary | ICD-10-CM | POA: Diagnosis not present

## 2024-03-17 DIAGNOSIS — M4807 Spinal stenosis, lumbosacral region: Secondary | ICD-10-CM | POA: Diagnosis not present

## 2024-03-17 DIAGNOSIS — M898X9 Other specified disorders of bone, unspecified site: Secondary | ICD-10-CM | POA: Diagnosis not present

## 2024-03-17 DIAGNOSIS — R1030 Lower abdominal pain, unspecified: Secondary | ICD-10-CM | POA: Diagnosis not present

## 2024-03-17 DIAGNOSIS — M7602 Gluteal tendinitis, left hip: Secondary | ICD-10-CM | POA: Diagnosis not present

## 2024-03-17 DIAGNOSIS — N5082 Scrotal pain: Secondary | ICD-10-CM | POA: Diagnosis not present

## 2024-03-17 DIAGNOSIS — M546 Pain in thoracic spine: Secondary | ICD-10-CM | POA: Diagnosis not present

## 2024-03-17 DIAGNOSIS — R339 Retention of urine, unspecified: Secondary | ICD-10-CM | POA: Diagnosis not present

## 2024-03-17 DIAGNOSIS — Z7902 Long term (current) use of antithrombotics/antiplatelets: Secondary | ICD-10-CM | POA: Diagnosis not present

## 2024-03-17 DIAGNOSIS — M4802 Spinal stenosis, cervical region: Secondary | ICD-10-CM | POA: Diagnosis not present

## 2024-03-17 DIAGNOSIS — M51369 Other intervertebral disc degeneration, lumbar region without mention of lumbar back pain or lower extremity pain: Secondary | ICD-10-CM | POA: Diagnosis not present

## 2024-03-17 DIAGNOSIS — Z79899 Other long term (current) drug therapy: Secondary | ICD-10-CM | POA: Diagnosis not present

## 2024-03-17 DIAGNOSIS — M160 Bilateral primary osteoarthritis of hip: Secondary | ICD-10-CM | POA: Diagnosis not present

## 2024-03-17 DIAGNOSIS — M48061 Spinal stenosis, lumbar region without neurogenic claudication: Secondary | ICD-10-CM | POA: Diagnosis not present

## 2024-03-17 DIAGNOSIS — K5900 Constipation, unspecified: Secondary | ICD-10-CM | POA: Diagnosis not present

## 2024-03-17 LAB — BASIC METABOLIC PANEL WITH GFR
BUN/Creatinine Ratio: 11 (ref 10–24)
BUN: 10 mg/dL (ref 8–27)
CO2: 21 mmol/L (ref 20–29)
Calcium: 9.4 mg/dL (ref 8.6–10.2)
Chloride: 97 mmol/L (ref 96–106)
Creatinine, Ser: 0.94 mg/dL (ref 0.76–1.27)
Glucose: 102 mg/dL — ABNORMAL HIGH (ref 70–99)
Potassium: 4.5 mmol/L (ref 3.5–5.2)
Sodium: 134 mmol/L (ref 134–144)
eGFR: 92 mL/min/1.73 (ref >59)

## 2024-03-17 LAB — CBC WITH DIFFERENTIAL/PLATELET
Basophils Absolute: 0 x10E3/uL (ref 0.0–0.2)
Basos: 1 %
EOS (ABSOLUTE): 0.2 x10E3/uL (ref 0.0–0.4)
Eos: 3 %
Hematocrit: 39.8 % (ref 37.5–51.0)
Hemoglobin: 13.3 g/dL (ref 13.0–17.7)
Immature Grans (Abs): 0 x10E3/uL (ref 0.0–0.1)
Immature Granulocytes: 0 %
Lymphocytes Absolute: 1.2 x10E3/uL (ref 0.7–3.1)
Lymphs: 20 %
MCH: 30 pg (ref 26.6–33.0)
MCHC: 33.4 g/dL (ref 31.5–35.7)
MCV: 90 fL (ref 79–97)
Monocytes Absolute: 0.5 x10E3/uL (ref 0.1–0.9)
Monocytes: 9 %
Neutrophils Absolute: 4.1 x10E3/uL (ref 1.4–7.0)
Neutrophils: 67 %
Platelets: 300 x10E3/uL (ref 150–450)
RBC: 4.44 x10E6/uL (ref 4.14–5.80)
RDW: 14 % (ref 11.6–15.4)
WBC: 6 x10E3/uL (ref 3.4–10.8)

## 2024-03-17 LAB — IRON AND TIBC
Iron Saturation: 18 % (ref 15–55)
Iron: 63 ug/dL (ref 38–169)
Total Iron Binding Capacity: 354 ug/dL (ref 250–450)
UIBC: 291 ug/dL (ref 111–343)

## 2024-03-17 LAB — FERRITIN: Ferritin: 525 ng/mL — ABNORMAL HIGH (ref 30–400)

## 2024-03-17 LAB — TSH: TSH: 0.726 u[IU]/mL (ref 0.450–4.500)

## 2024-03-17 LAB — PSA: Prostate Specific Ag, Serum: 5.8 ng/mL — ABNORMAL HIGH (ref 0.0–4.0)

## 2024-03-18 ENCOUNTER — Ambulatory Visit: Payer: Self-pay

## 2024-03-18 ENCOUNTER — Telehealth: Payer: Self-pay | Admitting: Family Medicine

## 2024-03-18 NOTE — Telephone Encounter (Signed)
 FYI Only or Action Required?: Action required by provider: pt requesting PET scan and to see PCP if needed.  Patient was last seen in primary care on 03/16/2024 by Herold Hadassah SQUIBB, MD.  Called Nurse Triage reporting Generalized Body Aches.  Symptoms began several weeks ago.  Interventions attempted: Rest, hydration, or home remedies.  Symptoms are: gradually worsening.  Triage Disposition: See PCP Within 2 Weeks  Patient/caregiver understands and will follow disposition?: Yes, will follow disposition  Copied from CRM #[id number]. Topic: Clinical - Red Word Triage >> Mar 18, 2024  8:40 AM Treva T wrote: [facility] that prompted transfer to Nurse Triage: Received call from patient, emotional, states he has some type of blood/bone issues going on.   States he is severe bone pain, states mother had a blood disease and thinks it may be the same thing he is experiencing.   Unable to get any further information due to patient being emotional. Reason for Disposition  Body pains are a chronic symptom (recurrent or ongoing AND present > 4 weeks)  Answer Assessment - Initial Assessment Questions 1. ONSET: When did the muscle aches or body pains start?      1.5 weeks most recently 2. LOCATION: What part of your body is hurting? (e.g., entire body, arms, legs)      Pt states it is everywhere, states it is joints and bones. Pt states that collar bone, shoulder, arm, knee pain.  3. SEVERITY: How bad is the pain? (Scale 1-10; or mild, moderate, severe)     More pain then when seen Dr Herold 4. CAUSE: What do you think is causing the pains?     Pt states that he thinks he has lymphoma 5. FEVER: Do you have a fever? If Yes, ask: What is your temperature, how was it measured, and  when did it start?      denies 6. OTHER SYMPTOMS: Do you have any other symptoms? (e.g., chest pain, cold or flu symptoms, rash, weakness, weight loss)     constipation 8. TRAVEL: Have you traveled out of the  country in the last month? (e.g., exposures, travel history)     denies  Pt requesting PET scan. Pt states pain worse at night. Pt states I feel like I am dying. No appts available with PCP, pt requesting to see PCP. Pt states that he is also suffering from constipation, states that enemas are no longer helping him. Pt was seen by Dr Herold - 9/23. Routing to clinic for next steps.  Protocols used: Muscle Aches and Body Pain-A-AH

## 2024-03-18 NOTE — Telephone Encounter (Unsigned)
 Copied from CRM [id number]. Topic: Referral - Question >> Mar 18, 2024  2:18 PM Debby BROCKS wrote: Reason for CRM: Patient was recently referred to [facility]OLOGY However, they will not have an appointment unitl 6 months. Patient would like to see if someone else can be referred as he does not think he can wait 6 months to be seen

## 2024-03-19 ENCOUNTER — Ambulatory Visit: Payer: Self-pay | Admitting: Pediatrics

## 2024-03-19 NOTE — Telephone Encounter (Signed)
 Scheduled

## 2024-03-19 NOTE — Telephone Encounter (Signed)
 Can we please get him booked with me first same day available. Thanks.

## 2024-03-23 ENCOUNTER — Ambulatory Visit (INDEPENDENT_AMBULATORY_CARE_PROVIDER_SITE_OTHER): Admitting: Family Medicine

## 2024-03-23 ENCOUNTER — Encounter: Payer: Self-pay | Admitting: Pediatrics

## 2024-03-23 ENCOUNTER — Other Ambulatory Visit: Payer: Self-pay | Admitting: Family Medicine

## 2024-03-23 ENCOUNTER — Ambulatory Visit: Admitting: Family Medicine

## 2024-03-23 ENCOUNTER — Encounter: Payer: Self-pay | Admitting: Family Medicine

## 2024-03-23 VITALS — BP 117/79 | HR 97 | Temp 97.4°F | Ht 69.0 in | Wt 175.2 lb

## 2024-03-23 DIAGNOSIS — M2550 Pain in unspecified joint: Secondary | ICD-10-CM | POA: Diagnosis not present

## 2024-03-23 DIAGNOSIS — F419 Anxiety disorder, unspecified: Secondary | ICD-10-CM

## 2024-03-23 DIAGNOSIS — M4722 Other spondylosis with radiculopathy, cervical region: Secondary | ICD-10-CM | POA: Insufficient documentation

## 2024-03-23 DIAGNOSIS — F430 Acute stress reaction: Secondary | ICD-10-CM

## 2024-03-23 DIAGNOSIS — R972 Elevated prostate specific antigen [PSA]: Secondary | ICD-10-CM

## 2024-03-23 DIAGNOSIS — M4726 Other spondylosis with radiculopathy, lumbar region: Secondary | ICD-10-CM | POA: Diagnosis not present

## 2024-03-23 DIAGNOSIS — M160 Bilateral primary osteoarthritis of hip: Secondary | ICD-10-CM

## 2024-03-23 DIAGNOSIS — K5900 Constipation, unspecified: Secondary | ICD-10-CM

## 2024-03-23 DIAGNOSIS — R935 Abnormal findings on diagnostic imaging of other abdominal regions, including retroperitoneum: Secondary | ICD-10-CM

## 2024-03-23 MED ORDER — PREGABALIN 25 MG PO CAPS
ORAL_CAPSULE | ORAL | 2 refills | Status: DC
Start: 2024-03-23 — End: 2024-04-05

## 2024-03-23 MED ORDER — HYDROXYZINE HCL 10 MG PO TABS: 5.0000 mg | ORAL_TABLET | Freq: Three times a day (TID) | ORAL | 0 refills | Status: AC | PRN

## 2024-03-23 NOTE — Assessment & Plan Note (Signed)
 Will get him into PM&R and start him on lyrica. Follow up 1-2 weeks.

## 2024-03-23 NOTE — Progress Notes (Signed)
 BP 117/79 (BP Location: Left Arm, Patient Position: Sitting, Cuff Size: Normal)   Pulse 97   Temp (!) 97.4 F (36.3 C) (Oral)   Ht 5' 9 (1.753 m)   Wt 175 lb 3.2 oz (79.5 kg)   SpO2 97%   BMI 25.87 kg/m    Subjective:    Patient ID: Andres Dillon, male    DOB: 03-14-1961, 63 y.o.   MRN: [id number]  HPI: Andres Dillon is a 63 y.o. male  Chief Complaint  Patient presents with   Joint Pain    Onset about a few weeks ago  Would like pain meds    Back Pain   ER FOLLOW UP Time since discharge: 6 days Hospital/facility: [facility] Diagnosis: Joint pain and constipation Procedures/tests: labs- normal, MRI pelvis, neck, thorax and lumbar, AXR Consultants: None New medications: none Discharge instructions: referred to rheumatology, follow up here  Status: stable   ARTHRALGIAS / JOINT ACHES Duration: weeks Pain: yes Symmetric: no Severity: severe Quality: aching, tingling, shooting, burning Frequency: constant Context:  worse Decreased function/range of motion: no  Erythema: no Swelling: no Heat or warmth: no Morning stiffness: no Relief with NSAIDs?: mild Treatments attempted:  tramadol, rest, ice, heat, APAP, ibuprofen , and aleve  Involved Joints:     Hands: no     Wrists: yes right     Elbows: yes right    Shoulders: yes right    Back: yes     Hips: yes bilateral    Knees: yes bilateral    Ankles: yes bilateral L>R    Feet: no    Relevant past medical, surgical, family and social history reviewed and updated as indicated. Interim medical history since our last visit reviewed. Allergies and medications reviewed and updated.  Review of Systems  Constitutional:  Positive for fatigue and unexpected weight change. Negative for activity change, appetite change, chills, diaphoresis and fever.  HENT: Negative.    Respiratory: Negative.    Cardiovascular: Negative.   Musculoskeletal:  Positive for arthralgias, back pain, myalgias and neck pain. Negative  for gait problem and joint swelling.  Skin: Negative.   Neurological:  Positive for weakness and numbness. Negative for dizziness, tremors, seizures, syncope, facial asymmetry, speech difficulty, light-headedness and headaches.  Psychiatric/Behavioral:  Positive for sleep disturbance. Negative for agitation, behavioral problems, confusion, decreased concentration, dysphoric mood, hallucinations, self-injury and suicidal ideas. The patient is nervous/anxious. The patient is not hyperactive.     Per HPI unless specifically indicated above     Objective:    BP 117/79 (BP Location: Left Arm, Patient Position: Sitting, Cuff Size: Normal)   Pulse 97   Temp (!) 97.4 F (36.3 C) (Oral)   Ht 5' 9 (1.753 m)   Wt 175 lb 3.2 oz (79.5 kg)   SpO2 97%   BMI 25.87 kg/m   Wt Readings from Last 3 Encounters:  03/23/24 175 lb 3.2 oz (79.5 kg)  03/16/24 177 lb (80.3 kg)  03/09/24 186 lb 15.2 oz (84.8 kg)    Physical Exam Vitals and nursing note reviewed.  Constitutional:      General: He is not in acute distress.    Appearance: Normal appearance. He is not ill-appearing, toxic-appearing or diaphoretic.  HENT:     Head: Normocephalic and atraumatic.     Right Ear: External ear normal.     Left Ear: External ear normal.     Nose: Nose normal.     Mouth/Throat:     Mouth: Mucous membranes  are moist.     Pharynx: Oropharynx is clear.  Eyes:     General: No scleral icterus.       Right eye: No discharge.        Left eye: No discharge.     Extraocular Movements: Extraocular movements intact.     Conjunctiva/sclera: Conjunctivae normal.     Pupils: Pupils are equal, round, and reactive to light.  Cardiovascular:     Rate and Rhythm: Normal rate and regular rhythm.     Pulses: Normal pulses.     Heart sounds: Normal heart sounds. No murmur heard.    No friction rub. No gallop.  Pulmonary:     Effort: Pulmonary effort is normal. No respiratory distress.     Breath sounds: Normal breath  sounds. No stridor. No wheezing, rhonchi or rales.  Chest:     Chest wall: No tenderness.  Musculoskeletal:        General: Normal range of motion.     Cervical back: Normal range of motion and neck supple.  Skin:    General: Skin is warm and dry.     Capillary Refill: Capillary refill takes less than 2 seconds.     Coloration: Skin is not jaundiced or pale.     Findings: No bruising, erythema, lesion or rash.  Neurological:     General: No focal deficit present.     Mental Status: He is alert and oriented to person, place, and time. Mental status is at baseline.  Psychiatric:        Mood and Affect: Mood is anxious.        Behavior: Behavior normal.        Thought Content: Thought content normal.        Judgment: Judgment normal.     EXAM: Magnetic resonance imaging, spinal canal and contents, cervical, thoracic and lumbar without contrast material. DATE: 03/17/2024 11:39 AM ACCESSION: [id number] UN DICTATED: 03/17/2024 11:59 AM INTERPRETATION LOCATION: [facility]  CLINICAL INDICATION: 63 years old Male with new onset neck and back pain with constipation  COMPARISON: CT full spine 10/16/2017  TECHNIQUE: Multiplanar MRI was performed through the cervical, thoracic, and lumbar spine without intravenous contrast administration.  FINDINGS:  Postsurgical changes of posterior T10-L3 spinal fixation. Redemonstrated chronic fracture compression deformities of T12 and L1. Similar mild vertebral body height loss at T10, T11 and L2. Metallic susceptibility artifact significantly limits evaluation of the spinal canal and neural foramen at the level of thoracolumbar junction including T10-L3.  Multilevel disc desiccation of the spine.  The spinal cord and cauda equina is not well evaluated inferior to T10 due to metal artifact.  Bone marrow signal intensity is normal. The visualized cord is unremarkable in appearance.  CERVICAL: Mild reversal of usual cervical lordosis with apex  at C3-C4. Posterior central disc osteophyte complex at C3-C4 with mild spinal canal and moderate bilateral neural foraminal stenosis. Posterior central disc osteophyte complex at C4-C5 without spinal canal stenosis. Severe right and mild left C4-C5 neural foraminal stenosis. No significant spinal canal or neural foraminal narrowing at other cervical levels.  THORACIC: Multilevel disc desiccation, endplate Schmorl nodes and minimal disc bulges. No significant spinal canal or neural foraminal narrowing of the visualized portions superior to T10.  LUMBAR: The spinal canal and neural foramen are not well evaluated from L1-L4 due to metal artifact.  Left central/subarticular disc protrusion at L4-L5 which results in moderate spinal canal stenosis, effacement of the left greater than right lateral recesses and impinging the  descending left L5 nerve root. Facet arthropathy and bulging disc producing bilateral mild to moderate neural foraminal narrowing.  Posterior central disc protrusion at L5-S1 without significant stenosis. Bilateral facet arthropathy and bulging disc producing moderate bilateral neural foraminal narrowing.  The paraspinal tissues are within normal limits.  IMPRESSION: At the T10-L3 levels, the spinal canal, neural foramina, and distal spinal cord cannot be evaluated or visualized due to significant susceptibility artifact from fixation hardware. Within this limitation;  1. Left central/subarticular L4-L5 disc protrusion which results in moderate spinal stenosis, effacement of the left greater than right lateral recess and impinging the descending left L5 nerve root. Mild to moderate neural foraminal narrowing at bilateral L4-L5 and L5-S1, as detailed. 2. Disc osteophyte complexes in the cervical spine that result in up to mild spinal canal stenosis at C3-C4 and up to severe neural foraminal stenosis on the right at C4-C5.   EXAM: MRI PELVIS WO CONTRAST MSK DATE: 03/17/2024 11:56  AM ACCESSION: [id number] UN DICTATED: 03/17/2024 11:59 AM INTERPRETATION LOCATION: [facility]AMP[facility]  CLINICAL INDICATION: 63 years old Male with pelvic pain    COMPARISON: 10/16/2017  TECHNIQUE: MRI of the pelvis was performed using a local coil.  Multisequence, multiplanar images were obtained without contrast.    FINDINGS:   Evaluation of the bone marrow reveals normal marrow signal throughout the visualized bones of the pelvis, proximal femurs and lower lumbar spine. No evidence for acute fracture. Partially visualized degenerative disc disease in the lower lumbar spine with disc bulges/protrusions and endplate degenerative changes.  Severe bilateral hip osteoarthrosis with full-thickness cartilage thinning and subchondral degenerative signal changes. Bilateral acetabular labral tears. Right paralabral cyst containing debris measuring up to 1.8 cm (5:25).  Mild bilateral gluteus minimus and medius tendinosis. The musculature of the pelvis is normal and symmetric. Hamstring tendon origins are normal.  No evidence of bursitis.  The sacroiliac joints are approximated on the large field-of-view images.  The sciatic nerves are normal in course and contour.  Intrapelvic contents are unremarkable.    EXAM: XR ABDOMEN 1 VIEW ACCESSION: [id number] UN REPORT DATE: 03/17/2024 10:01 AM  CLINICAL INDICATION: 63 years old with CONSTIPATION  COMPARISON: CT abdomen and pelvis 10/16/2017.  TECHNIQUE: Supine view of the abdomen, 2 image(s)  FINDINGS: Lung bases are clear. Mild colonic stool volume, with small amount of stool in the rectum. Gas-filled, nondilated loops of small and large bowel. No acute osseous abnormalities. Degenerative changes of the spine. Posterior spinal fixation of the thoracolumbar spine.  IMPRESSION: Nonobstructive bowel gas pattern. Mild colonic stool burden.   Results for orders placed or performed in visit on 03/16/24  Basic Metabolic Panel (BMET)   Collection Time:  03/16/24  3:04 PM  Result Value Ref Range   Glucose 102 (H) 70 - 99 mg/dL   BUN 10 8 - 27 mg/dL   Creatinine, Ser 9.05 0.76 - 1.27 mg/dL   eGFR 92 >40 fO/fpw/8.26   BUN/Creatinine Ratio 11 10 - 24   Sodium 134 134 - 144 mmol/L   Potassium 4.5 3.5 - 5.2 mmol/L   Chloride 97 96 - 106 mmol/L   CO2 21 20 - 29 mmol/L   Calcium 9.4 8.6 - 10.2 mg/dL  TSH   Collection Time: 03/16/24  3:04 PM  Result Value Ref Range   TSH 0.726 0.450 - 4.500 uIU/mL  Ferritin   Collection Time: 03/16/24  3:04 PM  Result Value Ref Range   Ferritin 525 (H) 30 - 400 ng/mL  Iron and TIBC   Collection Time:  03/16/24  3:04 PM  Result Value Ref Range   Total Iron Binding Capacity 354 250 - 450 ug/dL   UIBC 708 888 - 656 ug/dL   Iron 63 38 - 830 ug/dL   Iron Saturation 18 15 - 55 %  CBC w/Diff   Collection Time: 03/16/24  3:04 PM  Result Value Ref Range   WBC 6.0 3.4 - 10.8 x10E3/uL   RBC 4.44 4.14 - 5.80 x10E6/uL   Hemoglobin 13.3 13.0 - 17.7 g/dL   Hematocrit 60.1 62.4 - 51.0 %   MCV 90 79 - 97 fL   MCH 30.0 26.6 - 33.0 pg   MCHC 33.4 31.5 - 35.7 g/dL   RDW 85.9 88.3 - 84.5 %   Platelets 300 150 - 450 x10E3/uL   Neutrophils 67 Not Estab. %   Lymphs 20 Not Estab. %   Monocytes 9 Not Estab. %   Eos 3 Not Estab. %   Basos 1 Not Estab. %   Neutrophils Absolute 4.1 1.4 - 7.0 x10E3/uL   Lymphocytes Absolute 1.2 0.7 - 3.1 x10E3/uL   Monocytes Absolute 0.5 0.1 - 0.9 x10E3/uL   EOS (ABSOLUTE) 0.2 0.0 - 0.4 x10E3/uL   Basophils Absolute 0.0 0.0 - 0.2 x10E3/uL   Immature Granulocytes 0 Not Estab. %   Immature Grans (Abs) 0.0 0.0 - 0.1 x10E3/uL  PSA   Collection Time: 03/16/24  3:04 PM  Result Value Ref Range   Prostate Specific Ag, Serum 5.8 (H) 0.0 - 4.0 ng/mL      Assessment & Plan:   Problem List Items Addressed This Visit       Nervous and Auditory   Osteoarthritis of spine with radiculopathy, cervical region   Will get him into PM&R and start him on lyrica. Follow up 1-2 weeks.        Relevant Medications   pregabalin (LYRICA) 25 MG capsule   hydrOXYzine  (ATARAX ) 10 MG tablet   Other Relevant Orders   Ambulatory referral to Physical Medicine Rehab   Osteoarthritis of spine with radiculopathy, lumbar region   Will get him into PM&R and start him on lyrica. Follow up 1-2 weeks.       Relevant Medications   pregabalin (LYRICA) 25 MG capsule   hydrOXYzine  (ATARAX ) 10 MG tablet   Other Relevant Orders   Ambulatory referral to Physical Medicine Rehab     Musculoskeletal and Integument   Osteoarthritis of both hips   Will get him into ortho and start him on lyrica. Follow up 1-2 weeks.       Relevant Orders   Ambulatory referral to Orthopedic Surgery   Other Visit Diagnoses       Arthralgia, unspecified joint    -  Primary   Will check labs to look for inflammatory arthritis. Await results.   Relevant Orders   Spotted Fever Group Antibodies   RA Qn+CCP(IgG/A)+SjoSSA+SjoSSB   Uric acid   TSH   ANA 12 Plus Profile (RDL)     Acute stress reaction       Not doing well with current medical issues- will take his hydroxyzine . Follow up in 1-2 weeks.   Relevant Medications   hydrOXYzine  (ATARAX ) 10 MG tablet     Constipation, unspecified constipation type       Improving. Will see GI. Consider amitiza/linzess if not improving.     Anxiety       Relevant Medications   hydrOXYzine  (ATARAX ) 10 MG tablet        Follow up  plan: Return in about 2 weeks (around 04/06/2024).

## 2024-03-23 NOTE — Assessment & Plan Note (Signed)
 Will get him into ortho and start him on lyrica. Follow up 1-2 weeks.

## 2024-03-23 NOTE — Patient Instructions (Signed)
 EXAM: Magnetic resonance imaging, spinal canal and contents, cervical, thoracic and lumbar without contrast material. DATE: 03/17/2024 11:39 AM ACCESSION: [id number] UN DICTATED: 03/17/2024 11:59 AM INTERPRETATION LOCATION: [facility]  CLINICAL INDICATION: 63 years old Male with new onset neck and back pain with constipation  COMPARISON: CT full spine 10/16/2017  TECHNIQUE: Multiplanar MRI was performed through the cervical, thoracic, and lumbar spine without intravenous contrast administration.  FINDINGS:  Postsurgical changes of posterior T10-L3 spinal fixation. Redemonstrated chronic fracture compression deformities of T12 and L1. Similar mild vertebral body height loss at T10, T11 and L2. Metallic susceptibility artifact significantly limits evaluation of the spinal canal and neural foramen at the level of thoracolumbar junction including T10-L3.  Multilevel disc desiccation of the spine.  The spinal cord and cauda equina is not well evaluated inferior to T10 due to metal artifact.  Bone marrow signal intensity is normal. The visualized cord is unremarkable in appearance.  CERVICAL: Mild reversal of usual cervical lordosis with apex at C3-C4. Posterior central disc osteophyte complex at C3-C4 with mild spinal canal and moderate bilateral neural foraminal stenosis. Posterior central disc osteophyte complex at C4-C5 without spinal canal stenosis. Severe right and mild left C4-C5 neural foraminal stenosis. No significant spinal canal or neural foraminal narrowing at other cervical levels.  THORACIC: Multilevel disc desiccation, endplate Schmorl nodes and minimal disc bulges. No significant spinal canal or neural foraminal narrowing of the visualized portions superior to T10.  LUMBAR: The spinal canal and neural foramen are not well evaluated from L1-L4 due to metal artifact.  Left central/subarticular disc protrusion at L4-L5 which results in moderate spinal canal stenosis,  effacement of the left greater than right lateral recesses and impinging the descending left L5 nerve root. Facet arthropathy and bulging disc producing bilateral mild to moderate neural foraminal narrowing.  Posterior central disc protrusion at L5-S1 without significant stenosis. Bilateral facet arthropathy and bulging disc producing moderate bilateral neural foraminal narrowing.  The paraspinal tissues are within normal limits.  IMPRESSION: At the T10-L3 levels, the spinal canal, neural foramina, and distal spinal cord cannot be evaluated or visualized due to significant susceptibility artifact from fixation hardware. Within this limitation;  1. Left central/subarticular L4-L5 disc protrusion which results in moderate spinal stenosis, effacement of the left greater than right lateral recess and impinging the descending left L5 nerve root. Mild to moderate neural foraminal narrowing at bilateral L4-L5 and L5-S1, as detailed. 2. Disc osteophyte complexes in the cervical spine that result in up to mild spinal canal stenosis at C3-C4 and up to severe neural foraminal stenosis on the right at C4-C5.

## 2024-03-24 ENCOUNTER — Ambulatory Visit: Admitting: Urology

## 2024-03-25 ENCOUNTER — Telehealth: Payer: Self-pay | Admitting: Urology

## 2024-03-25 NOTE — Telephone Encounter (Signed)
 Patient was scheduled for appointment yesterday to discuss prostate biopsy however it had to be unexpectedly canceled and I have informed them I would contact him this week to discuss.  Recent PSA had decreased to 5.4 and he was already scheduled for a follow-up PSA later this month.  He had requested that a free PSA to be added to this lab draw.  He is requesting if he needed a prostate biopsy that it be performed under sedation.  I discussed based on his PI-RADS 3 lesion he would need a MR fusion biopsy and we currently do not have the capability of performing these under sedation however he could be referred to another facility to have biopsy performed under sedation.  He would like to await his follow-up PSA results before deciding

## 2024-03-26 ENCOUNTER — Other Ambulatory Visit: Payer: Self-pay | Admitting: *Deleted

## 2024-03-26 DIAGNOSIS — R972 Elevated prostate specific antigen [PSA]: Secondary | ICD-10-CM

## 2024-03-26 NOTE — Telephone Encounter (Signed)
 done

## 2024-03-30 ENCOUNTER — Ambulatory Visit: Payer: Self-pay | Admitting: Family Medicine

## 2024-03-30 DIAGNOSIS — R7689 Other specified abnormal immunological findings in serum: Secondary | ICD-10-CM

## 2024-03-30 DIAGNOSIS — M2550 Pain in unspecified joint: Secondary | ICD-10-CM

## 2024-03-30 NOTE — Progress Notes (Unsigned)
 Chief Complaint: Constipation  HPI:    Andres Dillon is a 63 year old Caucasian male with a past medical history as listed below including GERD, history of colon cancer in his father at the age of 72 and maternal grandfather at the age of 2 as well as paternal uncles x 2 with colon cancer and a maternal cousin died of esophageal cancer at the age of 73-21, who was referred to me by Herold Hadassah SQUIBB, MD for a complaint of constipation.      10/27/2012 colonoscopy done for a family history of colon cancer in his father and a personal history of colon polyps revealed a 5 mm sessile tubular adenomatous polyp removed in the transverse colon and internal hemorrhoids.    10/27/2012 EGD done for reflux with LA grade a esophagitis without bleeding.  Otherwise normal.  Pathology of the stomach revealed mild chronic focally active gastritis and mild foveolar hyperplasia.  Negative H. pylori.  Mild acute and chronic inflammation in the squamous mucosa of the distal esophagus negative for Barrett's.  No repeat EGD recommended.    11/27/2017 colonoscopy done for history of colon polyps family history of colon cancer with 2 diminutive polyps and otherwise normal.  Pathology showed colonic mucosa with focal glandular hyperplasia and benign lymphoid aggregate.  Repeat colonoscopy recommended 11/2022.    09/11/2021 patient seen by the [facility] clinic gastroenterology for GERD and bright red rectal bleeding with a family history of colon cancer and a personal history of adenomatous colon polyps.    03/13/2024 CMP with a total bili of 1.3 otherwise normal, normal CBC.  CRP less than 5.    03/13/2024 patient seen in the ER for constipation.  Described having to use enemas to get any stool out for the past week and a half.  MRI of the pelvis showed severe bilateral hip osteoarthritis with full-thickness cartilage thinning and subchondral degenerative signal changes.  Bilateral acetabular labral tears.  Mild bilateral gluteus minimus and  medius tendinosis.    03/13/2024 x-ray of the abdomen showed mild colonic stool burden.  Past Medical History:  Diagnosis Date   Arthritis    GERD (gastroesophageal reflux disease)     Past Surgical History:  Procedure Laterality Date   COLONOSCOPY  2014   SPINE SURGERY  12/23/1995   fell/ rods and screws T10 /L2 fusions    Current Outpatient Medications  Medication Sig Dispense Refill   amitriptyline  (ELAVIL ) 50 MG tablet Take 1 tablet (50 mg total) by mouth at bedtime as needed for sleep. 30 tablet 1   bisacodyl  (DULCOLAX) 5 MG EC tablet Take 1 tablet (5 mg total) by mouth daily as needed for moderate constipation. 30 tablet 0   cyclobenzaprine  (FLEXERIL ) 5 MG tablet TAKE 1-2 TABLETS (5-10 MG TOTAL) BY MOUTH 3 (THREE) TIMES DAILY AS NEEDED. 90 tablet 0   hydrOXYzine  (ATARAX ) 10 MG tablet Take 0.5-1 tablets (5-10 mg total) by mouth 3 (three) times daily as needed. 270 tablet 0   polyethylene glycol (MIRALAX ) 17 g packet Take 17 g by mouth daily. 30 packet 0   pregabalin (LYRICA) 25 MG capsule Take 1 pill at bedtime for 3-4 days, then 1 pill in AM and PM for 3-4 days, then 1 pill 3x a day for 3-4 days. 1 pill in the AM and PM and 2 pills in the PM for 3-4 days, 2 pills in the AM 1 in the PM and 2 at bedtime for 3-4 days then 2 TID 180 capsule 2  Simethicone  80 MG TABS Take 1 tablet (80 mg total) by mouth every 6 (six) hours as needed for up to 14 days (For bowel distention or bowel gast). 30 tablet 0   tadalafil  (CIALIS ) 20 MG tablet Take 1 tablet (20 mg total) by mouth daily as needed for erectile dysfunction. 30 tablet 11   tamsulosin  (FLOMAX ) 0.4 MG CAPS capsule TAKE 1 CAPSULE BY MOUTH EVERY DAY 90 capsule 2   No current facility-administered medications for this visit.    Allergies as of 03/31/2024   (No Known Allergies)    Family History  Problem Relation Age of Onset   Cancer Mother    Cancer Father    Heart disease Father 47       CABG    Kidney disease Father      Social History   Socioeconomic History   Marital status: Single    Spouse name: Not on file   Number of children: Not on file   Years of education: Not on file   Highest education level: Not on file  Occupational History   Not on file  Tobacco Use   Smoking status: Never   Smokeless tobacco: Never  Vaping Use   Vaping status: Never Used  Substance and Sexual Activity   Alcohol use: Yes    Alcohol/week: 12.0 standard drinks of alcohol    Types: 12 Cans of beer per week   Drug use: No   Sexual activity: Yes  Other Topics Concern   Not on file  Social History Narrative   Not on file   Social Drivers of Health   Financial Resource Strain: Low Risk  (08/11/2023)   Overall Financial Resource Strain (CARDIA)    Difficulty of Paying Living Expenses: Not hard at all  Food Insecurity: No Food Insecurity (08/11/2023)   Hunger Vital Sign    Worried About Running Out of Food in the Last Year: Never true    Ran Out of Food in the Last Year: Never true  Transportation Needs: No Transportation Needs (08/11/2023)   PRAPARE - Administrator, Civil Service (Medical): No    Lack of Transportation (Non-Medical): No  Physical Activity: Insufficiently Active (08/11/2023)   Exercise Vital Sign    Days of Exercise per Week: 3 days    Minutes of Exercise per Session: 30 min  Stress: No Stress Concern Present (08/11/2023)   [employer] of Occupational Health - Occupational Stress Questionnaire    Feeling of Stress : Not at all  Social Connections: Socially Isolated (08/11/2023)   Social Connection and Isolation Panel    Frequency of Communication with Friends and Family: Once a week    Frequency of Social Gatherings with Friends and Family: Once a week    Attends Religious Services: Never    Database administrator or Organizations: No    Attends Banker Meetings: Never    Marital Status: Married  Catering manager Violence: Not At Risk (08/11/2023)    Humiliation, Afraid, Rape, and Kick questionnaire    Fear of Current or Ex-Partner: No    Emotionally Abused: No    Physically Abused: No    Sexually Abused: No    Review of Systems:    Constitutional: No weight loss, fever, chills, weakness or fatigue HEENT: Eyes: No change in vision               Ears, Nose, Throat:  No change in hearing or congestion Skin: No rash or itching  Cardiovascular: No chest pain, chest pressure or palpitations   Respiratory: No SOB or cough Gastrointestinal: See HPI and otherwise negative Genitourinary: No dysuria or change in urinary frequency Neurological: No headache, dizziness or syncope Musculoskeletal: No new muscle or joint pain Hematologic: No bleeding or bruising Psychiatric: No history of depression or anxiety    Physical Exam:  Vital signs: There were no vitals taken for this visit.  Constitutional:   Pleasant Caucasian male appears to be in NAD, Well developed, Well nourished, alert and cooperative Head:  Normocephalic and atraumatic. Eyes:   PEERL, EOMI. No icterus. Conjunctiva pink. Ears:  Normal auditory acuity. Neck:  Supple Throat: Oral cavity and pharynx without inflammation, swelling or lesion.  Respiratory: Respirations even and unlabored. Lungs clear to auscultation bilaterally.   No wheezes, crackles, or rhonchi.  Cardiovascular: Normal S1, S2. No MRG. Regular rate and rhythm. No peripheral edema, cyanosis or pallor.  Gastrointestinal:  Soft, nondistended, nontender. No rebound or guarding. Normal bowel sounds. No appreciable masses or hepatomegaly. Rectal:  Not performed.  Msk:  Symmetrical without gross deformities. Without edema, no deformity or joint abnormality.  Neurologic:  Alert and  oriented x4;  grossly normal neurologically.  Skin:   Dry and intact without significant lesions or rashes. Psychiatric: Oriented to person, place and time. Demonstrates good judgement and reason without abnormal affect or  behaviors.  RELEVANT LABS AND IMAGING: CBC    Component Value Date/Time   WBC 6.0 03/16/2024 1504   WBC 6.2 03/14/2024 1207   RBC 4.44 03/16/2024 1504   RBC 4.40 03/14/2024 1207   HGB 13.3 03/16/2024 1504   HCT 39.8 03/16/2024 1504   PLT 300 03/16/2024 1504   MCV 90 03/16/2024 1504   MCH 30.0 03/16/2024 1504   MCH 30.7 03/14/2024 1207   MCHC 33.4 03/16/2024 1504   MCHC 34.7 03/14/2024 1207   RDW 14.0 03/16/2024 1504   LYMPHSABS 1.2 03/16/2024 1504   MONOABS 0.5 03/09/2024 0553   EOSABS 0.2 03/16/2024 1504   BASOSABS 0.0 03/16/2024 1504    CMP     Component Value Date/Time   NA 134 03/16/2024 1504   K 4.5 03/16/2024 1504   CL 97 03/16/2024 1504   CO2 21 03/16/2024 1504   GLUCOSE 102 (H) 03/16/2024 1504   GLUCOSE 129 (H) 03/14/2024 1207   BUN 10 03/16/2024 1504   CREATININE 0.94 03/16/2024 1504   CALCIUM 9.4 03/16/2024 1504   PROT 6.7 03/14/2024 1207   PROT 6.5 08/11/2023 1422   ALBUMIN 4.1 03/14/2024 1207   ALBUMIN 4.4 08/11/2023 1422   AST 28 03/14/2024 1207   ALT 39 03/14/2024 1207   ALKPHOS 38 03/14/2024 1207   BILITOT 1.0 03/14/2024 1207   BILITOT 0.4 08/11/2023 1422   GFRNONAA >60 03/14/2024 1207   GFRAA 82 06/30/2020 1322    Assessment: 1.  Constipation  Plan: 1. ***     Andres Failing, PA-C [facility] 03/30/2024, 3:20 PM  Cc: Herold Hadassah SQUIBB, MD

## 2024-03-31 ENCOUNTER — Ambulatory Visit (INDEPENDENT_AMBULATORY_CARE_PROVIDER_SITE_OTHER): Admitting: Physician Assistant

## 2024-03-31 ENCOUNTER — Encounter: Payer: Self-pay | Admitting: Physician Assistant

## 2024-03-31 VITALS — BP 122/70 | HR 66 | Ht 69.0 in | Wt 181.0 lb

## 2024-03-31 DIAGNOSIS — R972 Elevated prostate specific antigen [PSA]: Secondary | ICD-10-CM

## 2024-03-31 DIAGNOSIS — K5900 Constipation, unspecified: Secondary | ICD-10-CM

## 2024-03-31 DIAGNOSIS — M898X9 Other specified disorders of bone, unspecified site: Secondary | ICD-10-CM

## 2024-03-31 DIAGNOSIS — Z800 Family history of malignant neoplasm of digestive organs: Secondary | ICD-10-CM | POA: Diagnosis not present

## 2024-03-31 DIAGNOSIS — R194 Change in bowel habit: Secondary | ICD-10-CM

## 2024-03-31 NOTE — Progress Notes (Signed)
 ____________________________________________________________  Attending physician addendum:  Thank you for sending this case to me. I have reviewed the entire note and agree with the plan.  Agree the constipation appears related to the acute back issues and medications for that.  Victory Brand, MD  ____________________________________________________________

## 2024-04-05 ENCOUNTER — Other Ambulatory Visit: Payer: Self-pay | Admitting: *Deleted

## 2024-04-05 ENCOUNTER — Telehealth: Payer: Self-pay

## 2024-04-05 ENCOUNTER — Encounter: Payer: Self-pay | Admitting: Family Medicine

## 2024-04-05 ENCOUNTER — Ambulatory Visit: Admitting: Family Medicine

## 2024-04-05 VITALS — BP 117/87 | HR 100 | Temp 98.0°F | Ht 69.0 in | Wt 186.0 lb

## 2024-04-05 DIAGNOSIS — M2550 Pain in unspecified joint: Secondary | ICD-10-CM | POA: Diagnosis not present

## 2024-04-05 DIAGNOSIS — F430 Acute stress reaction: Secondary | ICD-10-CM

## 2024-04-05 NOTE — Progress Notes (Signed)
 BP 117/87   Pulse 100   Temp 98 F (36.7 C) (Oral)   Ht 5' 9 (1.753 m)   Wt 186 lb (84.4 kg)   SpO2 97%   BMI 27.47 kg/m    Subjective:    Patient ID: Andres Dillon, male    DOB: 1960/10/28, 63 y.o.   MRN: [id number]  HPI: Andres Dillon is a 63 y.o. male  Chief Complaint  Patient presents with   Joint Pain   Continues with bone pain. He notes that his pain continues to move around. He has been very anxious. Has not seen any specialists since he was here last time- has not heard about prostate biopsy under sedation. Cancelled appointment with ortho because his hip has been feeling better. He has an appointment scheduled with rheumatology in November. He has not started his cymbalta. He has not been sleeping. He has been incredibly anxious.   Relevant past medical, surgical, family and social history reviewed and updated as indicated. Interim medical history since our last visit reviewed. Allergies and medications reviewed and updated.  Review of Systems  Constitutional: Negative.   Respiratory: Negative.    Musculoskeletal:  Positive for arthralgias, joint swelling and myalgias. Negative for back pain, gait problem, neck pain and neck stiffness.  Skin: Negative.   Neurological: Negative.   Psychiatric/Behavioral:  Positive for dysphoric mood and sleep disturbance. Negative for agitation, behavioral problems, confusion, decreased concentration, hallucinations, self-injury and suicidal ideas. The patient is nervous/anxious. The patient is not hyperactive.     Per HPI unless specifically indicated above     Objective:    BP 117/87   Pulse 100   Temp 98 F (36.7 C) (Oral)   Ht 5' 9 (1.753 m)   Wt 186 lb (84.4 kg)   SpO2 97%   BMI 27.47 kg/m   Wt Readings from Last 3 Encounters:  04/05/24 186 lb (84.4 kg)  03/31/24 181 lb (82.1 kg)  03/23/24 175 lb 3.2 oz (79.5 kg)    Physical Exam Vitals and nursing note reviewed.  Constitutional:      General: He is not in  acute distress.    Appearance: Normal appearance. He is not ill-appearing, toxic-appearing or diaphoretic.  HENT:     Head: Normocephalic and atraumatic.     Right Ear: External ear normal.     Left Ear: External ear normal.     Nose: Nose normal.     Mouth/Throat:     Mouth: Mucous membranes are moist.     Pharynx: Oropharynx is clear.  Eyes:     General: No scleral icterus.       Right eye: No discharge.        Left eye: No discharge.     Extraocular Movements: Extraocular movements intact.     Conjunctiva/sclera: Conjunctivae normal.     Pupils: Pupils are equal, round, and reactive to light.  Cardiovascular:     Rate and Rhythm: Normal rate and regular rhythm.     Pulses: Normal pulses.     Heart sounds: Normal heart sounds. No murmur heard.    No friction rub. No gallop.  Pulmonary:     Effort: Pulmonary effort is normal. No respiratory distress.     Breath sounds: Normal breath sounds. No stridor. No wheezing, rhonchi or rales.  Chest:     Chest wall: No tenderness.  Musculoskeletal:        General: Normal range of motion.     Cervical back:  Normal range of motion and neck supple.  Skin:    General: Skin is warm and dry.     Capillary Refill: Capillary refill takes less than 2 seconds.     Coloration: Skin is not jaundiced or pale.     Findings: No bruising, erythema, lesion or rash.  Neurological:     General: No focal deficit present.     Mental Status: He is alert and oriented to person, place, and time. Mental status is at baseline.  Psychiatric:        Mood and Affect: Mood is anxious.        Behavior: Behavior normal.        Thought Content: Thought content normal.        Judgment: Judgment normal.     Results for orders placed or performed in visit on 03/23/24  Spotted Fever Group Antibodies   Collection Time: 03/23/24 12:19 PM  Result Value Ref Range   Spotted Fever Group IgG <1:64 Neg:<1:64   Spotted Fever Group IgM <1:64 Neg:<1:64   Result Comment  Comment   RA Qn+CCP(IgG/A)+SjoSSA+SjoSSB   Collection Time: 03/23/24 12:19 PM  Result Value Ref Range   Rheumatoid fact SerPl-aCnc <10.0 <14.0 IU/mL   ENA SSA (RO) Ab <0.2 0.0 - 0.9 AI   ENA SSB (LA) Ab <0.2 0.0 - 0.9 AI   Cyclic Citrullin Peptide Ab <1 0 - 19 units  Uric acid   Collection Time: 03/23/24 12:19 PM  Result Value Ref Range   Uric Acid 5.0 3.8 - 8.4 mg/dL  TSH   Collection Time: 03/23/24 12:19 PM  Result Value Ref Range   TSH 0.450 0.450 - 4.500 uIU/mL  ANA 12 Plus Profile (RDL)   Collection Time: 03/23/24 12:19 PM  Result Value Ref Range   Anti-Nuclear Ab by IFA (RDL) Positive (A) Negative  ANA 12 Plus Profile, Positive   Collection Time: 03/23/24 12:19 PM  Result Value Ref Range   Homogeneous Pattern 1:80 (H) <1:40   Speckled Pattern 1:80 (H) <1:40   Note: Comment    Anti-Centromere Ab (RDL) <1:40 <1:40   Anti-dsDNA Ab by Farr(RDL) <8.0 <8.0 IU/mL   Anti-Sm Ab (RDL) <20 <20 Units   Anti-U1 RNP Ab (RDL) <20 <20 Units   Anti-Ro (SS-A) Ab (RDL) <20 <20 Units   Anti-La (SS-B) Ab (RDL) <20 <20 Units   Anti-Scl-70 Ab (RDL) <20 <20 Units   Anti-Cardiolipin Ab, IgG (RDL) <15 <15 GPL U/mL   Anti-Cardiolipin Ab, IgA (RDL) <12 <12 APL U/mL   Anti-Cardiolipin Ab, IgM (RDL) <13 <13 MPL U/mL   C3 Complement (RDL) 174 90 - 180 mg/dL   C4 Complement (RDL) 33 10 - 40 mg/dL   Anti-TPO Ab (RDL) <0.9 <9.0 IU/mL   Anti-Chromatin Ab, IgG (RDL) <20 <20 Units   Anti-CCP Ab, IgG & IgA (RDL) <20 <20 Units   Rheumatoid Factor by Turb RDL CANCELED IU/mL   ANA Plus 12 Interpretation Comment       Assessment & Plan:   Problem List Items Addressed This Visit   None Visit Diagnoses       Acute stress reaction    -  Primary   Encouraged him to start his cymbalta for both stress and pain. Referrals pending to rheumatology, urology and PM&R. Recheck in about 2 weeks. Call with concerns   Relevant Medications   DULoxetine (CYMBALTA) 20 MG capsule     Arthralgia, unspecified joint        Encouraged him to start  his cymbalta for both stress and pain. Referrals pending to rheumatology, urology and PM&R. Recheck in about 2 weeks. Call with concerns        Follow up plan: Return in about 3 weeks (around 04/26/2024).  >25 minutes spent with patient today.

## 2024-04-05 NOTE — Patient Outreach (Signed)
 Complex Care Management   Visit Note  04/05/2024  Name:  Andres Dillon MRN: [id number] DOB: 08/10/1960  Situation: Referral received for Complex Care Management related to Mental/Behavioral Health diagnosis anxiety I obtained verbal consent from Patient.  Visit completed with Patient  on the phone. Upon completion of assessment, patient declined further follow up. This Child psychotherapist provided patient with contact information in the event that any issues arise in the future.  Background:   Past Medical History:  Diagnosis Date   Arthritis    GERD (gastroesophageal reflux disease)     Assessment: Patient Reported Symptoms:  Cognitive Cognitive Status: Able to follow simple commands, Alert and oriented to person, place, and time, Insightful and able to interpret abstract concepts Cognitive/Intellectual Conditions Management [RPT]: None reported or documented in medical history or problem list   Health Maintenance Behaviors: Annual physical exam Healing Pattern: Slow Health Facilitated by: Pain control  Neurological Neurological Review of Symptoms: No symptoms reported    HEENT HEENT Symptoms Reported: No symptoms reported      Cardiovascular Cardiovascular Symptoms Reported: No symptoms reported    Respiratory Respiratory Symptoms Reported: No symptoms reported    Endocrine Endocrine Symptoms Reported: No symptoms reported Is patient diabetic?: No    Gastrointestinal Gastrointestinal Symptoms Reported: No symptoms reported      Genitourinary Genitourinary Symptoms Reported: No symptoms reported    Integumentary Integumentary Symptoms Reported: No symptoms reported    Musculoskeletal Musculoskelatal Symptoms Reviewed: Back pain, Joint pain, Muscle pain Additional Musculoskeletal Details: per patient, referral made to reumatologist -appt scheduled for the end of November-prescribed lyrica but caused teeth pain-called in celebrex Musculoskeletal Management Strategies:  Medication therapy      Psychosocial       Quality of Family Relationships: supportive    04/05/2024    PHQ2-9 Depression Screening   Little interest or pleasure in doing things Several days  Feeling down, depressed, or hopeless Several days  PHQ-2 - Total Score 2  Trouble falling or staying asleep, or sleeping too much Several days  Feeling tired or having little energy Several days  Poor appetite or overeating  Not at all  Feeling bad about yourself - or that you are a failure or have let yourself or your family down Not at all  Trouble concentrating on things, such as reading the newspaper or watching television Not at all  Moving or speaking so slowly that other people could have noticed.  Or the opposite - being so fidgety or restless that you have been moving around a lot more than usual Not at all  Thoughts that you would be better off dead, or hurting yourself in some way Not at all  PHQ2-9 Total Score 4  If you checked off any problems, how difficult have these problems made it for you to do your work, take care of things at home, or get along with other people    Depression Interventions/Treatment      There were no vitals filed for this visit.  Medications Reviewed Today     Reviewed by Ermalinda Lenn HERO, LCSW (Social Worker) on 04/05/24 at 1236  Med List Status: <None>   Medication Order Taking? Sig Documenting Provider Last Dose Status Informant  amitriptyline  (ELAVIL ) 50 MG tablet [id number] Yes Take 1 tablet (50 mg total) by mouth at bedtime as needed for sleep. Herold Hadassah SQUIBB, MD  Active   bisacodyl  (DULCOLAX) 5 MG EC tablet [id number] Yes Take 1 tablet (5 mg total) by mouth daily  as needed for moderate constipation. Nicholaus Rolland BRAVO, MD  Active   cyclobenzaprine  (FLEXERIL ) 5 MG tablet [id number] Yes TAKE 1-2 TABLETS (5-10 MG TOTAL) BY MOUTH 3 (THREE) TIMES DAILY AS NEEDED. Johnson, Megan P, DO  Active   hydrOXYzine  (ATARAX ) 10 MG tablet [id number] Yes Take 0.5-1  tablets (5-10 mg total) by mouth 3 (three) times daily as needed. Johnson, Megan P, DO  Active   polyethylene glycol (MIRALAX ) 17 g packet [id number] Yes Take 17 g by mouth daily. Nicholaus Rolland BRAVO, MD  Active   Simethicone  80 MG TABS [id number] Yes Take 1 tablet (80 mg total) by mouth every 6 (six) hours as needed for up to 14 days (For bowel distention or bowel gast). Nicholaus Rolland BRAVO, MD  Active   tadalafil  (CIALIS ) 20 MG tablet [id number] Yes Take 1 tablet (20 mg total) by mouth daily as needed for erectile dysfunction. Penne Knee, MD  Active   tamsulosin  (FLOMAX ) 0.4 MG CAPS capsule [id number] Yes TAKE 1 CAPSULE BY MOUTH EVERY DAY Penne Knee, MD  Active             Recommendation:   PCP Follow-up Specialty provider follow-up as scheduled Ongoing mental health support as needed  Follow Up Plan:   No follow up needed at this time, patient declined further follow up  Custer Pimenta, LCSW [facility]  [facility], [facility] Licensed Clinical Social Worker  Direct Dial: [phone]

## 2024-04-05 NOTE — Telephone Encounter (Unsigned)
 Copied from CRM [id number]. Topic: Clinical - Medication Question >> Apr 05, 2024  4:37 PM Lonell PEDLAR wrote: Reason for CRM: Patient called stating that Dr. Vicci was to send a rx to the pharmacy, unable to recall name, please advise.

## 2024-04-05 NOTE — Patient Instructions (Signed)
 Visit Information  Thank you for taking time to visit with me today. Please don't hesitate to contact me if I can be of assistance to you at anytime   no further scheduled appointments.   Closing From: Complex Care Management.  Please call the care guide team at [phone] if you need to cancel, schedule, or reschedule an appointment.   Please call the Suicide and Crisis Lifeline: 988 call the [place]  National Suicide Prevention Lifeline: 848-452-7783 or TTY: (443)244-8133 TTY (920)679-0360) to talk to a trained counselor call 1-800-273-TALK (toll free, 24 hour hotline) call 911 if you are experiencing a Mental Health or Behavioral Health Crisis or need someone to talk to.  Alicea Wente, LCSW [place]  [facility], [facility] Licensed Clinical Social Worker  Direct Dial: [phone]

## 2024-04-06 NOTE — Telephone Encounter (Unsigned)
 Copied from CRM [id number]. Topic: Clinical - Medication Question >> Apr 06, 2024  4:34 PM Sophia H wrote: Reason for CRM: Patient is checking in on medication that should've been prescribed again, states he would like a call from nurse ASAP.  Advised messages have been routed just waiting on providers orders. # [phone]

## 2024-04-06 NOTE — Telephone Encounter (Unsigned)
 Copied from CRM #[id number]. Topic: Clinical - Prescription Issue >> Apr 06, 2024 11:57 AM Berwyn MATSU wrote: Reason for CRM: patient calling back to check status of medication. Per patient she was supposed to prescribe Cymbalta or Celebrex.  May you please advise.

## 2024-04-07 ENCOUNTER — Other Ambulatory Visit: Payer: Self-pay | Admitting: Pediatrics

## 2024-04-07 DIAGNOSIS — R52 Pain, unspecified: Secondary | ICD-10-CM

## 2024-04-07 DIAGNOSIS — K5900 Constipation, unspecified: Secondary | ICD-10-CM

## 2024-04-07 LAB — SPOTTED FEVER GROUP ANTIBODIES
Spotted Fever Group IgG: <1:64 {titer}
Spotted Fever Group IgM: <1:64 {titer}

## 2024-04-07 LAB — ANA 12 PLUS PROFILE, POSITIVE
Anti-CCP Ab, IgG & IgA (RDL): <20 U (ref <20)
Anti-Cardiolipin Ab, IgA (RDL): <12 U/mL (ref <12)
Anti-Cardiolipin Ab, IgG (RDL): <15 GPL U/mL (ref <15)
Anti-Cardiolipin Ab, IgM (RDL): <13 [MPL'U]/mL (ref <13)
Anti-Centromere Ab (RDL): <1:40 {titer}
Anti-Chromatin Ab, IgG (RDL): <20 U (ref <20)
Anti-La (SS-B) Ab (RDL): <20 U (ref <20)
Anti-Ro (SS-A) Ab (RDL): <20 U (ref <20)
Anti-Scl-70 Ab (RDL): <20 U (ref <20)
Anti-Sm Ab (RDL): <20 U (ref <20)
Anti-TPO Ab (RDL): <9 [IU]/mL (ref <9.0)
Anti-U1 RNP Ab (RDL): <20 U (ref <20)
Anti-dsDNA Ab by Farr(RDL): <8 [IU]/mL (ref <8.0)
C3 Complement (RDL): 174 mg/dL (ref 90–180)
C4 Complement (RDL): 33 mg/dL (ref 10–40)
Homogeneous Pattern: 1:80 {titer} — ABNORMAL HIGH
Speckled Pattern: 1:80 {titer} — ABNORMAL HIGH

## 2024-04-07 LAB — RA QN+CCP(IGG/A)+SJOSSA+SJOSSB
Cyclic Citrullin Peptide Ab: <1 U (ref 0–19)
ENA SSA (RO) Ab: <0.2 AI (ref 0.0–0.9)
ENA SSB (LA) Ab: <0.2 AI (ref 0.0–0.9)
Rheumatoid fact SerPl-aCnc: <10 [IU]/mL (ref <14.0)

## 2024-04-07 LAB — ANA 12 PLUS PROFILE (RDL): Anti-Nuclear Ab by IFA (RDL): POSITIVE — AB

## 2024-04-07 LAB — URIC ACID: Uric Acid: 5 mg/dL (ref 3.8–8.4)

## 2024-04-07 LAB — TSH: TSH: 0.45 u[IU]/mL (ref 0.450–4.500)

## 2024-04-07 MED ORDER — DULOXETINE HCL 20 MG PO CPEP: 20.0000 mg | ORAL_CAPSULE | Freq: Every day | ORAL | 1 refills | Status: DC

## 2024-04-07 NOTE — Telephone Encounter (Signed)
 Routing to provider to advise. Was a medication going to be sent in for the patient?

## 2024-04-07 NOTE — Telephone Encounter (Signed)
 Rx sent.

## 2024-04-09 ENCOUNTER — Telehealth: Payer: Self-pay | Admitting: Family Medicine

## 2024-04-09 NOTE — Telephone Encounter (Signed)
 Copied from CRM [id number]. Topic: Referral - Question >> Apr 09, 2024 12:22 PM Terri MATSU wrote: Reason for CRM: Patient stated Dr.Johnson sent the referral twice but [facility] urology stated they don't have it. Their fax number is [phone]

## 2024-04-09 NOTE — Telephone Encounter (Signed)
 Refilling for 90 days.  Requested Prescriptions  Pending Prescriptions Disp Refills   amitriptyline  (ELAVIL ) 50 MG tablet [Pharmacy Med Name: AMITRIPTYLINE  HCL 50 MG TAB] 90 tablet 0    Sig: TAKE 1 TABLET BY MOUTH AT BEDTIME AS NEEDED FOR SLEEP.     Psychiatry:  Antidepressants - Heterocyclics (TCAs) Passed - 04/09/2024  9:58 AM      Passed - Valid encounter within last 6 months    Recent Outpatient Visits           4 days ago    [facility], [place] P, DO   2 weeks ago Arthralgia, unspecified joint   [place] [facility], Megan P, DO   3 weeks ago Constipation, unspecified constipation type   [place] [facility] Herold Hadassah SQUIBB, MD   1 month ago Primary osteoarthritis of right hip   [place] [facility], Selinda PARAS, MD   1 month ago Strain of adductor magnus muscle of right lower extremity, initial encounter   [facility], Selinda PARAS, MD       Future Appointments             In 4 months Vicci Duwaine SQUIBB, DO [facility] [facility], [address]   In 4 months [place], Glendia BROCKS, MD [facility]

## 2024-04-12 NOTE — Telephone Encounter (Signed)
 Referral team, can you guys check on this referral for the patient please?

## 2024-04-14 ENCOUNTER — Other Ambulatory Visit

## 2024-04-19 ENCOUNTER — Other Ambulatory Visit

## 2024-04-19 DIAGNOSIS — R972 Elevated prostate specific antigen [PSA]: Secondary | ICD-10-CM

## 2024-04-20 DIAGNOSIS — R972 Elevated prostate specific antigen [PSA]: Secondary | ICD-10-CM | POA: Diagnosis not present

## 2024-04-20 LAB — PSA, TOTAL AND FREE
PSA, Free Pct: 10.7 %
PSA, Free: 0.8 ng/mL
Prostate Specific Ag, Serum: 7.5 ng/mL — ABNORMAL HIGH (ref 0.0–4.0)

## 2024-04-21 DIAGNOSIS — R972 Elevated prostate specific antigen [PSA]: Secondary | ICD-10-CM | POA: Diagnosis not present

## 2024-04-21 DIAGNOSIS — N4289 Other specified disorders of prostate: Secondary | ICD-10-CM | POA: Diagnosis not present

## 2024-04-22 ENCOUNTER — Other Ambulatory Visit: Payer: Self-pay | Admitting: Family Medicine

## 2024-04-22 DIAGNOSIS — S76211A Strain of adductor muscle, fascia and tendon of right thigh, initial encounter: Secondary | ICD-10-CM

## 2024-04-23 NOTE — Telephone Encounter (Signed)
 Requested medications are due for refill today.  yes  Requested medications are on the active medications list.  yes  Last refill. 03/15/2024 #90 0 rf  Future visit scheduled.   yes  Notes to clinic.  Refill not delegated.    Requested Prescriptions  Pending Prescriptions Disp Refills   cyclobenzaprine  (FLEXERIL ) 5 MG tablet [Pharmacy Med Name: CYCLOBENZAPRINE  5 MG TABLET] 90 tablet 0    Sig: TAKE 1 TO 2 TABLETS BY MOUTH 3 TIMES A DAY AS NEEDED     Not Delegated - Analgesics:  Muscle Relaxants Failed - 04/23/2024  3:41 PM      Failed - This refill cannot be delegated      Passed - Valid encounter within last 6 months    Recent Outpatient Visits           2 weeks ago Acute stress reaction   [place] [facility], Megan P, DO   1 month ago Arthralgia, unspecified joint   [place], Megan P, DO   1 month ago Constipation, unspecified constipation type   [place] [facility] Herold Hadassah SQUIBB, MD   1 month ago Primary osteoarthritis of right hip   [place] [facility], Selinda PARAS, MD   2 months ago Strain of adductor magnus muscle of right lower extremity, initial encounter   [facility] at [facility], Selinda PARAS, MD       Future Appointments             In 3 months Vicci Duwaine SQUIBB, DO [place] [facility], [address]   In 4 months [place], Glendia BROCKS, MD [facility]

## 2024-04-26 ENCOUNTER — Encounter: Payer: Self-pay | Admitting: Family Medicine

## 2024-04-26 ENCOUNTER — Ambulatory Visit (INDEPENDENT_AMBULATORY_CARE_PROVIDER_SITE_OTHER): Admitting: Family Medicine

## 2024-04-26 VITALS — BP 108/78 | HR 88 | Temp 97.7°F | Ht 69.0 in | Wt 187.6 lb

## 2024-04-26 DIAGNOSIS — R52 Pain, unspecified: Secondary | ICD-10-CM

## 2024-04-26 DIAGNOSIS — F430 Acute stress reaction: Secondary | ICD-10-CM

## 2024-04-26 MED ORDER — DULOXETINE HCL 20 MG PO CPEP: 20.0000 mg | ORAL_CAPSULE | Freq: Two times a day (BID) | ORAL | 1 refills | Status: DC

## 2024-04-26 MED ORDER — GABAPENTIN 300 MG PO CAPS: 300.0000 mg | ORAL_CAPSULE | Freq: Two times a day (BID) | ORAL | 1 refills | Status: DC

## 2024-04-26 NOTE — Progress Notes (Signed)
 BP 108/78   Pulse 88   Temp 97.7 F (36.5 C) (Oral)   Ht 5' 9 (1.753 m)   Wt 187 lb 9.6 oz (85.1 kg)   SpO2 98%   BMI 27.70 kg/m    Subjective:    Patient ID: Andres Dillon, male    DOB: Jul 14, 1960, 63 y.o.   MRN: [id number]  HPI: Andres Dillon is a 63 y.o. male  Chief Complaint  Patient presents with   Stress   Joint Pain   Joints are doing better, but felt like the cymbalta was not lasting long enough. He self-increased to 40-60mg  of cymbalta a day and started taking 300mg  BID of gabapentin  that belonged to his girlfriend. He notes that he's feeling more like himself again. Has an appointment to see his rheumatologist in about 3 weeks.   ANXIETY/STRESS Duration: months Status:better Anxious mood: yes  Excessive worrying: yes Irritability: no  Sweating: no Nausea: no Palpitations:no Hyperventilation: no Panic attacks: no Agoraphobia: no  Obscessions/compulsions: no Depressed mood: yes    04/05/2024   11:19 AM 03/16/2024    2:32 PM 02/20/2024    9:37 AM 08/11/2023    2:20 PM 11/11/2022   11:34 AM  Depression screen PHQ 2/9  Decreased Interest 1 2 0 0 0  Down, Depressed, Hopeless 1 2 0 0 0  PHQ - 2 Score 2 4 0 0 0  Altered sleeping 1 2  2 2   Tired, decreased energy 1 2  1 2   Change in appetite 0 3  0 1  Feeling bad or failure about yourself  0 2  0 0  Trouble concentrating 0 2  0 1  Moving slowly or fidgety/restless 0 1  0 1  Suicidal thoughts 0 1  0 0  PHQ-9 Score 4 17  3 7   Difficult doing work/chores  Not difficult at all   Somewhat difficult      04/05/2024   11:22 AM 03/16/2024    2:32 PM 08/11/2023    2:20 PM 11/11/2022   11:35 AM  GAD 7 : Generalized Anxiety Score  Nervous, Anxious, on Edge 1 1 0 1  Control/stop worrying 2 1 0 1  Worry too much - different things 0 1 0 1  Trouble relaxing 1 2 0 2  Restless 1 1 0 1  Easily annoyed or irritable 1 1 0 2  Afraid - awful might happen 1 1 0 1  Total GAD 7 Score 7 8 0 9  Anxiety Difficulty  Somewhat difficult Not difficult at all Not difficult at all Somewhat difficult   Anhedonia: no Weight changes: no Insomnia: yes   Hypersomnia: no Fatigue/loss of energy: yes Feelings of worthlessness: no Feelings of guilt: no Impaired concentration/indecisiveness: no Suicidal ideations: no  Crying spells: no Recent Stressors/Life Changes: yes   Relationship problems: no   Family stress: no     Financial stress: no    Job stress: no    Recent death/loss: no  Relevant past medical, surgical, family and social history reviewed and updated as indicated. Interim medical history since our last visit reviewed. Allergies and medications reviewed and updated.  Review of Systems  Constitutional: Negative.   Respiratory: Negative.    Cardiovascular: Negative.   Musculoskeletal:  Positive for arthralgias, back pain, joint swelling and myalgias. Negative for gait problem, neck pain and neck stiffness.  Skin: Negative.   Neurological: Negative.   Psychiatric/Behavioral:  Positive for dysphoric mood. Negative for agitation, behavioral problems, confusion,  decreased concentration, hallucinations, self-injury, sleep disturbance and suicidal ideas. The patient is nervous/anxious. The patient is not hyperactive.     Per HPI unless specifically indicated above     Objective:    BP 108/78   Pulse 88   Temp 97.7 F (36.5 C) (Oral)   Ht 5' 9 (1.753 m)   Wt 187 lb 9.6 oz (85.1 kg)   SpO2 98%   BMI 27.70 kg/m   Wt Readings from Last 3 Encounters:  04/26/24 187 lb 9.6 oz (85.1 kg)  04/05/24 186 lb (84.4 kg)  03/31/24 181 lb (82.1 kg)    Physical Exam Vitals and nursing note reviewed.  Constitutional:      General: He is not in acute distress.    Appearance: Normal appearance. He is not ill-appearing, toxic-appearing or diaphoretic.  HENT:     Head: Normocephalic and atraumatic.     Right Ear: External ear normal.     Left Ear: External ear normal.     Nose: Nose normal.      Mouth/Throat:     Mouth: Mucous membranes are moist.     Pharynx: Oropharynx is clear.  Eyes:     General: No scleral icterus.       Right eye: No discharge.        Left eye: No discharge.     Extraocular Movements: Extraocular movements intact.     Conjunctiva/sclera: Conjunctivae normal.     Pupils: Pupils are equal, round, and reactive to light.  Cardiovascular:     Rate and Rhythm: Normal rate and regular rhythm.     Pulses: Normal pulses.     Heart sounds: Normal heart sounds. No murmur heard.    No friction rub. No gallop.  Pulmonary:     Effort: Pulmonary effort is normal. No respiratory distress.     Breath sounds: Normal breath sounds. No stridor. No wheezing, rhonchi or rales.  Chest:     Chest wall: No tenderness.  Musculoskeletal:        General: Normal range of motion.     Cervical back: Normal range of motion and neck supple.  Skin:    General: Skin is warm and dry.     Capillary Refill: Capillary refill takes less than 2 seconds.     Coloration: Skin is not jaundiced or pale.     Findings: No bruising, erythema, lesion or rash.  Neurological:     General: No focal deficit present.     Mental Status: He is alert and oriented to person, place, and time. Mental status is at baseline.  Psychiatric:        Mood and Affect: Mood normal.        Behavior: Behavior normal.        Thought Content: Thought content normal.        Judgment: Judgment normal.     Results for orders placed or performed in visit on 04/19/24  PSA, total and free   Collection Time: 04/19/24 10:47 AM  Result Value Ref Range   Prostate Specific Ag, Serum 7.5 (H) 0.0 - 4.0 ng/mL   PSA, Free 0.80 N/A ng/mL   PSA, Free Pct 10.7 %      Assessment & Plan:   Problem List Items Addressed This Visit   None Visit Diagnoses       Generalized pain    -  Primary   Doing better on gabapentin - will get him his own Rx and will increase his cymbalta to  40mg . Recheck in about 3 weeks. Call with any  concerns.     Acute stress reaction       Improved. Will increase his cymbalta to 40mg . Recheck in about 3 weeks. Call with any concerns.   Relevant Medications   DULoxetine (CYMBALTA) 20 MG capsule        Follow up plan: Return in about 3 weeks (around 05/17/2024).

## 2024-05-04 NOTE — Telephone Encounter (Signed)
 Post Op Appointment(s): APP Any available onc APP in 10 days Video Visits    DOS 12/15  DR TAN

## 2024-05-17 ENCOUNTER — Ambulatory Visit

## 2024-05-17 ENCOUNTER — Telehealth: Payer: Self-pay | Admitting: Family Medicine

## 2024-05-17 ENCOUNTER — Other Ambulatory Visit: Payer: Self-pay

## 2024-05-17 ENCOUNTER — Ambulatory Visit (INDEPENDENT_AMBULATORY_CARE_PROVIDER_SITE_OTHER)

## 2024-05-17 DIAGNOSIS — M25551 Pain in right hip: Secondary | ICD-10-CM

## 2024-05-17 DIAGNOSIS — M1611 Unilateral primary osteoarthritis, right hip: Secondary | ICD-10-CM | POA: Diagnosis not present

## 2024-05-17 DIAGNOSIS — M160 Bilateral primary osteoarthritis of hip: Secondary | ICD-10-CM | POA: Diagnosis not present

## 2024-05-17 DIAGNOSIS — M2550 Pain in unspecified joint: Secondary | ICD-10-CM | POA: Diagnosis not present

## 2024-05-17 DIAGNOSIS — M25552 Pain in left hip: Secondary | ICD-10-CM | POA: Diagnosis not present

## 2024-05-17 DIAGNOSIS — M791 Myalgia, unspecified site: Secondary | ICD-10-CM | POA: Diagnosis not present

## 2024-05-17 DIAGNOSIS — M47816 Spondylosis without myelopathy or radiculopathy, lumbar region: Secondary | ICD-10-CM | POA: Diagnosis not present

## 2024-05-17 NOTE — Telephone Encounter (Signed)
 Labs printed and faxed as requested

## 2024-05-17 NOTE — Telephone Encounter (Signed)
 Copied from CRM [id number]. Topic: General - Other >> May 17, 2024 10:19 AM Lonell PEDLAR wrote: Reason for CRM: PT IN [facility] called, requesting records, more specifically, lab results for positive ANA [phone]

## 2024-05-17 NOTE — Patient Instructions (Signed)

## 2024-05-17 NOTE — Progress Notes (Signed)
 Orthopaedic Surgery New Patient Visit   History of Present Illness: The patient is a 63 y.o. male seen in clinic for bilateral hip pain.  He reports symptoms started about 5 months ago when he was helping a friend with a car related task.  He states he attempted to lift something and felt pain in his right groin as a result.  He states that symptoms have persisted since then.  He also notes pain on the left side but not within his groin.  He notes that pain is worse with rotation of the right hip.  He was seen by Dr. Alvia and underwent an intra-articular hip injection.  He reports minimal relief from that injection.  Patient has been taking Cymbalta  and gabapentin  prescribed by his primary care provider at baseline.  Overall, patient does have complaints of joint pain all over his body including his wrists hips and ankles.  He is scheduled to see rheumatology later this morning for further evaluation.  Patient also with a history significant for T10-L2 fusion in 1997 secondary to a fall.  He also has an upcoming prostate biopsy in December to evaluate for lesions.  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     Past Medical, Social and Family History: Past Medical History:  Diagnosis Date   Arthritis    GERD (gastroesophageal reflux disease)    Past Surgical History:  Procedure Laterality Date   COLONOSCOPY  2014   SPINE SURGERY  12/23/1995   fell/ rods and screws T10 /L2 fusions   No Known Allergies Current Outpatient Medications on File Prior to Visit  Medication Sig Dispense Refill   amitriptyline  (ELAVIL ) 50 MG tablet TAKE 1 TABLET BY MOUTH AT BEDTIME AS NEEDED FOR SLEEP. 90 tablet 0   bisacodyl  (DULCOLAX) 5 MG EC tablet Take 1 tablet (5 mg total) by mouth daily as needed for moderate constipation. 30 tablet 0   cyclobenzaprine  (FLEXERIL ) 5 MG tablet TAKE 1 TO 2 TABLETS BY MOUTH 3 TIMES A DAY AS NEEDED 90 tablet 0   DULoxetine  (CYMBALTA ) 20 MG capsule Take 1  capsule (20 mg total) by mouth 2 (two) times daily. 60 capsule 1   gabapentin  (NEURONTIN ) 300 MG capsule Take 1 capsule (300 mg total) by mouth 2 (two) times daily. 60 capsule 1   hydrOXYzine  (ATARAX ) 10 MG tablet Take 0.5-1 tablets (5-10 mg total) by mouth 3 (three) times daily as needed. 270 tablet 0   Simethicone  80 MG TABS Take 1 tablet (80 mg total) by mouth every 6 (six) hours as needed for up to 14 days (For bowel distention or bowel gast). 30 tablet 0   tadalafil  (CIALIS ) 20 MG tablet Take 1 tablet (20 mg total) by mouth daily as needed for erectile dysfunction. 30 tablet 11   tamsulosin  (FLOMAX ) 0.4 MG CAPS capsule TAKE 1 CAPSULE BY MOUTH EVERY DAY 90 capsule 2   No current facility-administered medications on file prior to visit.   Social History   Tobacco Use   Smoking status: Never   Smokeless tobacco: Never  Vaping Use   Vaping status: Never Used  Substance Use Topics   Alcohol use: Yes    Alcohol/week: 12.0 standard drinks of alcohol    Types: 12 Cans of beer per week   Drug use: No      I have reviewed past medical, surgical, social and family history, medications and allergies as documented in the EMR.  Review of Systems - A ROS was performed  including pertinent positives and negatives as documented in the HPI.     Physical Exam:  General/Constitutional: NAD Vascular: No edema, swelling or tenderness, except as noted in detailed exam Integumentary: No impressive skin lesions present, except as noted in detailed exam Neuro/Psych: Normal mood and affect, oriented to person, place and time Musculoskeletal: Normal, except as noted in detailed exam and in HPI   Focused Orthopaedic Examination:  Hip Examination (focused):  On focused examination of the bilateral hips, no obvious signs of trauma or deformity.    RIGHT LEFT  ROM (degrees): Flexion-Extension 5-110  5-120    Abduction 35  35    Adduction 15  15    IR 10  25    ER 30  40   Palpation (pain):  Anterior Positive (groin) Positive (iliac crest)   Iliopsoas negative negative   Greater troch negative negative   ASIS negative negative   AIIS negative negative   Posterior negative negative   Adductors negative negative  Special Tests: FADIR positive negative   FABER negative negative   Log Roll negative negative  Other: Hip flexion strength  5/5 5/5   Hip adductor strength  5/5  5/5   Hip abductor strength  5/5 5/5    Vascular/Lymphatic: Bilateral lower extremities warm and well-perfused Neurologic: Sensation intact to light touch to Superficial peroneal/Deep peroneal/Tibial/Sural/Saphenous nerves          XR bilateral hip imaging: X-rays of the AP pelvis and AP/lateral bilateral hips were obtained in office today and reviewed with patient.  Per my interpretation, there are no acute fractures or dislocations.  Moderate to severe degenerative changes noted about the right hip with osteophytes noted about the superior acetabular rim and femoral head.  Mild degenerative changes noted about the left hip.   Assessment:  Bilateral hip osteoarthritis, right worse than left Left hip pointer  Plan:  Patient was seen and examined in office today. We reviewed patient's history, examination, and imaging in detail. Based on information available for this encounter, patient is likely symptomatic from right hip osteoarthritis.  He does have right groin pain as well as limited internal and external rotation with radiographic findings confirming moderate to severe osteoarthritis.  In regards to his left hip pain, pain is located along the iliac crest as opposed to within the groin region.  He does have tenderness along the iliac crest but no other exam findings.  We discussed the natural course of these processes and appropriate treatment moving forward.  He believe his symptoms are manageable at this time.  We discussed conservative treatment measures including formal physical therapy and  consideration of referral to joint arthroplasty specialist.  He would like to start with formal physical therapy and see how his symptoms respond.  I believe patient would also benefit from his upcoming rheumatology appointment to address multiple bilateral joints ailments.  There may be an underlying rheumatologic cause of some of these issues.  Patient also has an upcoming prostate biopsy which will provide valuable information for the patient.  Given the other appointments patient has scheduled, he likely will not begin physical therapy for a period of time.  He will notify the office when he would like to be seen back for reevaluation.    Patient education material was provided.  All questions, concerns and comments were addressed to the best of my ability.  Follow-up: Patient will call the office to schedule follow-up appointment in the new year.   Arlyss GEANNIE Schneider, DO  Orthopedic Surgery & Sports Medicine [place]   This document was dictated using Dragon voice recognition software. A reasonable attempt at proof reading has been made to minimize errors.

## 2024-05-18 ENCOUNTER — Other Ambulatory Visit: Payer: Self-pay | Admitting: Family Medicine

## 2024-05-21 NOTE — Telephone Encounter (Signed)
 Refilling for 90 days.  Requested Prescriptions  Pending Prescriptions Disp Refills   DULoxetine  (CYMBALTA ) 20 MG capsule [Pharmacy Med Name: DULOXETINE  HCL DR 20 MG CAP] 180 capsule 0    Sig: TAKE 1 CAPSULE BY MOUTH TWICE A DAY     Psychiatry: Antidepressants - SNRI - duloxetine  Passed - 05/21/2024 11:52 AM      Passed - Cr in normal range and within 360 days    Creatinine, Ser  Date Value Ref Range Status  03/16/2024 0.94 0.76 - 1.27 mg/dL Final         Passed - eGFR is 30 or above and within 360 days    GFR calc Af Amer  Date Value Ref Range Status  06/30/2020 82 >59 mL/min/1.73 Final    Comment:    **In accordance with recommendations from the NKF-ASN Task force,**   [facility] is in the process of updating its eGFR calculation to the   2021 CKD-EPI creatinine equation that estimates kidney function   without a race variable.    GFR, Estimated  Date Value Ref Range Status  03/14/2024 >60 >60 mL/min Final    Comment:    (NOTE) Calculated using the CKD-EPI Creatinine Equation (2021)    eGFR  Date Value Ref Range Status  03/16/2024 92 >59 mL/min/1.73 Final         Passed - Completed PHQ-2 or PHQ-9 in the last 360 days      Passed - Last BP in normal range    BP Readings from Last 1 Encounters:  04/26/24 108/78         Passed - Valid encounter within last 6 months    Recent Outpatient Visits           3 weeks ago Generalized pain   [place] [facility], Megan P, DO   1 month ago Acute stress reaction   [place] [facility] [place], Megan P, DO   1 month ago Arthralgia, unspecified joint   [place] [facility], Megan P, DO   2 months ago Constipation, unspecified constipation type   [place] [facility] Herold Hadassah SQUIBB, MD   2 months ago Primary osteoarthritis of right hip   [facility] at [facility], Selinda PARAS, MD       Future  Appointments             In 2 months Vicci Duwaine SQUIBB, DO [place] [facility], [address]   In 3 months [place], Glendia BROCKS, MD [facility]

## 2024-05-24 ENCOUNTER — Ambulatory Visit (INDEPENDENT_AMBULATORY_CARE_PROVIDER_SITE_OTHER): Admitting: Family Medicine

## 2024-05-24 ENCOUNTER — Encounter: Payer: Self-pay | Admitting: Family Medicine

## 2024-05-24 VITALS — BP 113/81 | HR 81 | Temp 98.0°F | Ht 69.0 in | Wt 194.0 lb

## 2024-05-24 DIAGNOSIS — R52 Pain, unspecified: Secondary | ICD-10-CM | POA: Diagnosis not present

## 2024-05-24 DIAGNOSIS — R972 Elevated prostate specific antigen [PSA]: Secondary | ICD-10-CM

## 2024-05-24 DIAGNOSIS — M160 Bilateral primary osteoarthritis of hip: Secondary | ICD-10-CM | POA: Diagnosis not present

## 2024-05-24 MED ORDER — TAMSULOSIN HCL 0.4 MG PO CAPS: 0.4000 mg | ORAL_CAPSULE | Freq: Every day | ORAL | 1 refills | Status: AC

## 2024-05-24 MED ORDER — GABAPENTIN 300 MG PO CAPS: 300.0000 mg | ORAL_CAPSULE | Freq: Two times a day (BID) | ORAL | 1 refills | Status: AC

## 2024-05-24 MED ORDER — DULOXETINE HCL 40 MG PO CPEP: 40.0000 mg | ORAL_CAPSULE | Freq: Every day | ORAL | 1 refills | Status: AC

## 2024-05-24 NOTE — Assessment & Plan Note (Signed)
 Following with ortho. Continue to follow. Call with any concerns.

## 2024-05-24 NOTE — Progress Notes (Signed)
 BP 113/81   Pulse 81   Temp 98 F (36.7 C) (Oral)   Ht 5' 9 (1.753 m)   Wt 194 lb (88 kg)   SpO2 97%   BMI 28.65 kg/m    Subjective:    Patient ID: Andres Dillon, male    DOB: Dec 29, 1960, 63 y.o.   MRN: [id number]  HPI: Andres Dillon is a 63 y.o. male  Chief Complaint  Patient presents with   Pain   Anxiety   Saw rheumatology last week. Had a ton of labs done. Note not available at this time. Continues with a lot of joint pain all over. Due to have prostate biopsy in about 2 weeks. He notes that he is better with taking the cymbalta - he will occasionally take a 3rd cymbalta . Gabapentin  has also helped. He noted that he's not as anxious and not as achey. He's still quite anxious about his prostate and what's causing the joint pain. No other concerns or complaints at this time.   Relevant past medical, surgical, family and social history reviewed and updated as indicated. Interim medical history since our last visit reviewed. Allergies and medications reviewed and updated.  Review of Systems  Constitutional: Negative.   Respiratory: Negative.    Cardiovascular: Negative.   Musculoskeletal:  Positive for arthralgias, back pain and myalgias. Negative for gait problem, joint swelling, neck pain and neck stiffness.  Skin: Negative.   Neurological: Negative.   Psychiatric/Behavioral: Negative.      Per HPI unless specifically indicated above     Objective:    BP 113/81   Pulse 81   Temp 98 F (36.7 C) (Oral)   Ht 5' 9 (1.753 m)   Wt 194 lb (88 kg)   SpO2 97%   BMI 28.65 kg/m   Wt Readings from Last 3 Encounters:  05/24/24 194 lb (88 kg)  04/26/24 187 lb 9.6 oz (85.1 kg)  04/05/24 186 lb (84.4 kg)    Physical Exam Vitals and nursing note reviewed.  Constitutional:      General: He is not in acute distress.    Appearance: Normal appearance. He is not ill-appearing, toxic-appearing or diaphoretic.  HENT:     Head: Normocephalic and atraumatic.     Right Ear:  External ear normal.     Left Ear: External ear normal.     Nose: Nose normal.     Mouth/Throat:     Mouth: Mucous membranes are moist.     Pharynx: Oropharynx is clear.  Eyes:     General: No scleral icterus.       Right eye: No discharge.        Left eye: No discharge.     Extraocular Movements: Extraocular movements intact.     Conjunctiva/sclera: Conjunctivae normal.     Pupils: Pupils are equal, round, and reactive to light.  Cardiovascular:     Rate and Rhythm: Normal rate and regular rhythm.     Pulses: Normal pulses.     Heart sounds: Normal heart sounds. No murmur heard.    No friction rub. No gallop.  Pulmonary:     Effort: Pulmonary effort is normal. No respiratory distress.     Breath sounds: Normal breath sounds. No stridor. No wheezing, rhonchi or rales.  Chest:     Chest wall: No tenderness.  Musculoskeletal:        General: Normal range of motion.     Cervical back: Normal range of motion and neck supple.  Skin:    General: Skin is warm and dry.     Capillary Refill: Capillary refill takes less than 2 seconds.     Coloration: Skin is not jaundiced or pale.     Findings: No bruising, erythema, lesion or rash.  Neurological:     General: No focal deficit present.     Mental Status: He is alert and oriented to person, place, and time. Mental status is at baseline.  Psychiatric:        Mood and Affect: Mood normal.        Behavior: Behavior normal.        Thought Content: Thought content normal.        Judgment: Judgment normal.     Results for orders placed or performed in visit on 04/19/24  PSA, total and free   Collection Time: 04/19/24 10:47 AM  Result Value Ref Range   Prostate Specific Ag, Serum 7.5 (H) 0.0 - 4.0 ng/mL   PSA, Free 0.80 N/A ng/mL   PSA, Free Pct 10.7 %      Assessment & Plan:   Problem List Items Addressed This Visit       Musculoskeletal and Integument   Osteoarthritis of both hips   Following with ortho. Continue to follow.  Call with any concerns.         Other   Generalized pain - Primary   Following with rheumatology- their note not available at this time. Labs still outstanding. Will continue his cymbalta  at 40mg  and recheck in 1 month. Call with any concerns.       Other Visit Diagnoses       Elevated PSA       Due for biopsy in 2 weeks. Await results. Treat as needed.        Follow up plan: Return in about 6 weeks (around 07/05/2024).

## 2024-05-24 NOTE — Assessment & Plan Note (Signed)
 Following with rheumatology- their note not available at this time. Labs still outstanding. Will continue his cymbalta  at 40mg  and recheck in 1 month. Call with any concerns.

## 2024-06-07 DIAGNOSIS — R972 Elevated prostate specific antigen [PSA]: Secondary | ICD-10-CM | POA: Diagnosis not present

## 2024-06-07 DIAGNOSIS — K219 Gastro-esophageal reflux disease without esophagitis: Secondary | ICD-10-CM | POA: Diagnosis not present

## 2024-06-07 DIAGNOSIS — C61 Malignant neoplasm of prostate: Secondary | ICD-10-CM | POA: Diagnosis not present

## 2024-06-07 DIAGNOSIS — Z79899 Other long term (current) drug therapy: Secondary | ICD-10-CM | POA: Diagnosis not present

## 2024-06-12 ENCOUNTER — Other Ambulatory Visit: Payer: Self-pay | Admitting: Family Medicine

## 2024-06-12 DIAGNOSIS — S76211A Strain of adductor muscle, fascia and tendon of right thigh, initial encounter: Secondary | ICD-10-CM

## 2024-06-28 ENCOUNTER — Encounter: Payer: Self-pay | Admitting: Family Medicine

## 2024-06-28 ENCOUNTER — Ambulatory Visit: Admitting: Family Medicine

## 2024-06-28 VITALS — BP 120/83 | HR 125 | Temp 98.2°F | Ht 69.0 in | Wt 190.2 lb

## 2024-06-28 DIAGNOSIS — C61 Malignant neoplasm of prostate: Secondary | ICD-10-CM | POA: Diagnosis not present

## 2024-06-28 DIAGNOSIS — R52 Pain, unspecified: Secondary | ICD-10-CM | POA: Diagnosis not present

## 2024-06-28 MED ORDER — DULOXETINE HCL 20 MG PO CPEP: 20.0000 mg | ORAL_CAPSULE | Freq: Every day | ORAL | 0 refills | Status: AC

## 2024-06-28 NOTE — Progress Notes (Signed)
**Note Andres-Identified via Obfuscation**  "  BP 120/83   Pulse (!) 125   Temp 98.2 F (36.8 C) (Oral)   Ht 5' 9 (1.753 m)   Wt 190 lb 3.2 oz (86.3 kg)   SpO2 98%   BMI 28.09 kg/m    Subjective:    Patient ID: Andres Dillon, male    DOB: 1961/04/30, 64 y.o.   MRN: [id number]  HPI: Andres Dillon is a 65 y.o. male  Chief Complaint  Patient presents with   Pain    Generalized all over body pain   Had his prostate biopsy done 06/07/24 and diagnosed with prostate cancer. To start treatment- has appointment on Thursday. Biopsy went OK, didn't have significant pain. Has a follow up with rheumatology in about a month. He notes that his pain is better, but still definitely there. The cymbalta  and the gabapentin  has been helping, but he is still in significant pain. Anxious about the oncology appointment on Thursday. No other concerns or complaints at this time.    Relevant past medical, surgical, family and social history reviewed and updated as indicated. Interim medical history since our last visit reviewed. Allergies and medications reviewed and updated.  Review of Systems  Constitutional: Negative.   Respiratory: Negative.    Cardiovascular: Negative.   Musculoskeletal:  Positive for arthralgias, back pain and myalgias. Negative for gait problem, joint swelling, neck pain and neck stiffness.  Neurological: Negative.   Psychiatric/Behavioral: Negative.      Per HPI unless specifically indicated above     Objective:    BP 120/83   Pulse (!) 125   Temp 98.2 F (36.8 C) (Oral)   Ht 5' 9 (1.753 m)   Wt 190 lb 3.2 oz (86.3 kg)   SpO2 98%   BMI 28.09 kg/m   Wt Readings from Last 3 Encounters:  06/28/24 190 lb 3.2 oz (86.3 kg)  05/24/24 194 lb (88 kg)  04/26/24 187 lb 9.6 oz (85.1 kg)    Physical Exam Vitals and nursing note reviewed.  Constitutional:      General: He is not in acute distress.    Appearance: Normal appearance. He is normal weight. He is not ill-appearing, toxic-appearing or diaphoretic.   HENT:     Head: Normocephalic and atraumatic.     Right Ear: External ear normal.     Left Ear: External ear normal.     Nose: Nose normal.     Mouth/Throat:     Mouth: Mucous membranes are moist.     Pharynx: Oropharynx is clear.  Eyes:     General: No scleral icterus.       Right eye: No discharge.        Left eye: No discharge.     Extraocular Movements: Extraocular movements intact.     Conjunctiva/sclera: Conjunctivae normal.     Pupils: Pupils are equal, round, and reactive to light.  Cardiovascular:     Rate and Rhythm: Normal rate and regular rhythm.     Pulses: Normal pulses.     Heart sounds: Normal heart sounds. No murmur heard.    No friction rub. No gallop.  Pulmonary:     Effort: Pulmonary effort is normal. No respiratory distress.     Breath sounds: Normal breath sounds. No stridor. No wheezing, rhonchi or rales.  Chest:     Chest wall: No tenderness.  Musculoskeletal:        General: Normal range of motion.     Cervical back: Normal range of motion  and neck supple.  Skin:    General: Skin is warm and dry.     Capillary Refill: Capillary refill takes less than 2 seconds.     Coloration: Skin is not jaundiced or pale.     Findings: No bruising, erythema, lesion or rash.  Neurological:     General: No focal deficit present.     Mental Status: He is alert and oriented to person, place, and time. Mental status is at baseline.  Psychiatric:        Mood and Affect: Mood normal.        Behavior: Behavior normal.        Thought Content: Thought content normal.        Judgment: Judgment normal.     Results for orders placed or performed in visit on 04/19/24  PSA, total and free   Collection Time: 04/19/24 10:47 AM  Result Value Ref Range   Prostate Specific Ag, Serum 7.5 (H) 0.0 - 4.0 ng/mL   PSA, Free 0.80 N/A ng/mL   PSA, Free Pct 10.7 %      Assessment & Plan:   Problem List Items Addressed This Visit       Genitourinary   Prostate cancer (HCC) -  Primary   Newly diagnosed. Due to see oncology on Thursday. Continue to follow.         Other   Generalized pain   Will increase his cymbalta  to 60mg  and recheck in 6 weeks. Continue his gabapentin . Call with any concerns.         Follow up plan: Return in about 6 weeks (around 08/09/2024) for As scheduled.      "

## 2024-06-28 NOTE — Assessment & Plan Note (Signed)
 Will increase his cymbalta  to 60mg  and recheck in 6 weeks. Continue his gabapentin . Call with any concerns.

## 2024-06-28 NOTE — Assessment & Plan Note (Signed)
 Newly diagnosed. Due to see oncology on Thursday. Continue to follow.

## 2024-07-04 ENCOUNTER — Other Ambulatory Visit: Payer: Self-pay | Admitting: Family Medicine

## 2024-07-04 DIAGNOSIS — K5900 Constipation, unspecified: Secondary | ICD-10-CM

## 2024-07-04 DIAGNOSIS — R52 Pain, unspecified: Secondary | ICD-10-CM

## 2024-07-19 ENCOUNTER — Ambulatory Visit

## 2024-07-23 ENCOUNTER — Ambulatory Visit

## 2024-07-26 ENCOUNTER — Ambulatory Visit

## 2024-07-30 ENCOUNTER — Ambulatory Visit

## 2024-08-02 ENCOUNTER — Ambulatory Visit

## 2024-08-06 ENCOUNTER — Ambulatory Visit

## 2024-08-09 ENCOUNTER — Ambulatory Visit

## 2024-08-12 ENCOUNTER — Encounter: Payer: BC Managed Care – PPO | Admitting: Family Medicine

## 2024-08-13 ENCOUNTER — Ambulatory Visit

## 2024-08-16 ENCOUNTER — Ambulatory Visit

## 2024-08-20 ENCOUNTER — Ambulatory Visit

## 2024-08-24 ENCOUNTER — Other Ambulatory Visit

## 2024-08-27 ENCOUNTER — Ambulatory Visit

## 2024-08-31 ENCOUNTER — Ambulatory Visit: Admitting: Urology
